# Patient Record
Sex: Male | Born: 2000 | Race: White | Hispanic: No | Marital: Single | Smoking: Current every day smoker
Health system: Southern US, Community
[De-identification: ages and names within clinical notes are randomized; demographics above are authoritative.]

## PROBLEM LIST (undated history)

## (undated) DIAGNOSIS — F329 Major depressive disorder, single episode, unspecified: Secondary | ICD-10-CM

## (undated) DIAGNOSIS — F32A Depression, unspecified: Secondary | ICD-10-CM

## (undated) DIAGNOSIS — G43909 Migraine, unspecified, not intractable, without status migrainosus: Secondary | ICD-10-CM

## (undated) HISTORY — DX: Depression, unspecified: F32.A

## (undated) HISTORY — DX: Major depressive disorder, single episode, unspecified: F32.9

## (undated) HISTORY — PX: OTHER SURGICAL HISTORY: SHX169

## (undated) HISTORY — DX: Migraine, unspecified, not intractable, without status migrainosus: G43.909

## (undated) HISTORY — PX: HERNIA REPAIR: SHX51

---
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
## 2018-08-24 ENCOUNTER — Telehealth: Payer: Self-pay

## 2018-08-24 NOTE — Telephone Encounter (Signed)
Pt mom advised we can see him on Thursday in office if worse need go to ED

## 2018-08-24 NOTE — Telephone Encounter (Signed)
-----   Message from Lyndon Code, MD sent at 08/24/2018  1:55 PM EDT ----- Jeff Coffey can see him next week, if worse got to ED  ----- Message ----- From: Golda Acre Sent: 08/24/2018  10:25 AM EDT To: Lyndon Code, MD  Pt mom called that his son is having chest pain and on and off going on for about 5 months  and no other symptoms and heart rate 129 he was last seen 4/19 no next appt and she said he had echo done my 2018 [facility] and care every where she she need referral for cardiologist

## 2018-08-27 ENCOUNTER — Encounter: Payer: Self-pay | Admitting: Nurse Practitioner

## 2018-08-27 ENCOUNTER — Other Ambulatory Visit: Payer: Self-pay

## 2018-08-27 ENCOUNTER — Ambulatory Visit (INDEPENDENT_AMBULATORY_CARE_PROVIDER_SITE_OTHER): Payer: Medicaid Other | Admitting: Nurse Practitioner

## 2018-08-27 VITALS — BP 108/82 | HR 60 | Resp 16 | Ht 67.0 in | Wt 124.0 lb

## 2018-08-27 DIAGNOSIS — F17210 Nicotine dependence, cigarettes, uncomplicated: Secondary | ICD-10-CM | POA: Diagnosis not present

## 2018-08-27 DIAGNOSIS — R630 Anorexia: Secondary | ICD-10-CM | POA: Insufficient documentation

## 2018-08-27 DIAGNOSIS — R9431 Abnormal electrocardiogram [ECG] [EKG]: Secondary | ICD-10-CM | POA: Diagnosis not present

## 2018-08-27 DIAGNOSIS — R002 Palpitations: Secondary | ICD-10-CM | POA: Diagnosis not present

## 2018-08-27 NOTE — Progress Notes (Signed)
[facility] [address] [place], [place] [address]  Internal MEDICINE  Office Visit Note  Patient Name: Jeff DawleyBilly Coffey  [birth year]  [id number]  Date of Service: 08/27/2018  Chief Complaint  Patient presents with  . Chest Pain    below left side feels a pain every once in a while , feels like heart races faster at times      The patient is c/o palpitations in the chest. Has been happening off and on for past five or six months. Does not hurt in his chest. Does cause him some shortness of breath. Gets worried when he feels the palpitations and may contribute to his shortness of breath. He is able to calm himself down and heart beat goes back to normal. He admits to drinking caffeine frequently, mostly in the form of [facility].  He is a smoker. States that he does not have much of an appetite. This does not contribute to feelings of palpitations.   Pt is here for a sick visit.     Current Medication:  Outpatient Encounter Medications as of 08/27/2018  Medication Sig  . butalbital-acetaminophen-caffeine (FIORICET, ESGIC) 50-325-40 MG tablet One tab po bid prn for migraine (Patient not taking: Reported on 08/27/2018)  . divalproex (DEPAKOTE ER) 500 MG 24 hr tablet Take one tab at bed time for migraine headaches (Patient not taking: Reported on 08/27/2018)  . [DISCONTINUED] amphetamine-dextroamphetamine (ADDERALL XR) 10 MG 24 hr capsule Take 10 mg by mouth daily.  . [DISCONTINUED] Aspirin-Acetaminophen-Caffeine ([employer]S MIGRAINE RELIEF PO) Take by mouth.  . [DISCONTINUED] omeprazole (PRILOSEC) 20 MG capsule Take 1 capsule (20 mg total) by mouth daily.   No facility-administered encounter medications on file as of 08/27/2018.       Medical History: Past Medical History:  Diagnosis Date  . Depression   . Migraines      Today's Vitals   08/27/18 0947  BP: 108/82  Pulse: 60  Resp: 16  SpO2: 95%  Weight: 124 lb (56.2 kg)  Height: 5\' 7"  (1.702 m)   Body mass index is  19.42 kg/m.  Review of Systems  Constitutional: Positive for appetite change. Negative for chills, fatigue and unexpected weight change.  HENT: Negative for congestion, postnasal drip, rhinorrhea, sneezing and sore throat.   Respiratory: Negative for cough, chest tightness, shortness of breath and wheezing.   Cardiovascular: Positive for palpitations. Negative for chest pain.  Gastrointestinal: Negative for abdominal pain, constipation, diarrhea, nausea and vomiting.  Endocrine: Negative for cold intolerance, heat intolerance, polydipsia and polyuria.  Musculoskeletal: Negative for arthralgias, back pain, joint swelling and neck pain.  Skin: Negative for rash.  Neurological: Negative for dizziness, tremors, numbness and headaches.  Hematological: Negative for adenopathy. Does not bruise/bleed easily.  Psychiatric/Behavioral: Negative for behavioral problems (Depression), sleep disturbance and suicidal ideas. The patient is nervous/anxious.     Physical Exam Vitals signs and nursing note reviewed.  Constitutional:      General: He is not in acute distress.    Appearance: Normal appearance. He is well-developed. He is not diaphoretic.  HENT:     Head: Normocephalic and atraumatic.     Mouth/Throat:     Pharynx: No oropharyngeal exudate.  Eyes:     Pupils: Pupils are equal, round, and reactive to light.  Neck:     Musculoskeletal: Normal range of motion and neck supple.     Thyroid: No thyromegaly.     Vascular: No JVD.     Trachea: No tracheal deviation.  Cardiovascular:  Rate and Rhythm: Normal rate and regular rhythm.     Heart sounds: Normal heart sounds. No murmur. No friction rub. No gallop.      Comments: ECG showing sinus rhythm with abnormal QRS complex.  Pulmonary:     Effort: Pulmonary effort is normal. No respiratory distress.     Breath sounds: Normal breath sounds. No wheezing or rales.  Chest:     Chest wall: No tenderness.  Abdominal:     General: Bowel  sounds are normal.     Palpations: Abdomen is soft.  Musculoskeletal: Normal range of motion.  Lymphadenopathy:     Cervical: No cervical adenopathy.  Skin:    General: Skin is warm and dry.  Neurological:     Mental Status: He is alert and oriented to person, place, and time.     Cranial Nerves: No cranial nerve deficit.  Psychiatric:        Behavior: Behavior normal.        Thought Content: Thought content normal.        Judgment: Judgment normal.   Assessment/Plan: 1. Palpitations ecg today showing sinus rhythm with abnormla QRS complex. Will get chest x-ray and echocardiogram for further evaluation. Will also check BMP, CBC, and thyroid panel. Advised him that nicotine and caffeine are both stimulants and may contribute to palpitations. He should cut back or eliminate these to see if this improves palpitations . - EKG 12-Lead - DG Chest 2 View; Future - ECHOCARDIOGRAM COMPLETE; Future  2. Abnormal ECG ecg today showing sinus rhythm with abnormla QRS complex. Will get chest x-ray and echocardiogram for further evaluation. Will also check BMP, CBC, and thyroid panel.  - ECHOCARDIOGRAM COMPLETE; Future  3. Decreased appetite Recommend he take prescribed prilosec   4. Cigarette smoker Discussed nicotine being stimulant and may contribute to palpitations. Encouraged him to cut back or stop altogether to improve symptoms .  General Counseling: Noel verbalizes understanding of the findings of todays visit and agrees with plan of treatment. I have discussed any further diagnostic evaluation that may be needed or ordered today. We also reviewed his medications today. he has been encouraged to call the office with any questions or concerns that should arise related to todays visit.    Counseling:  Smoking cessation counseling: 1. Pt acknowledges the risks of long term smoking, she will try to quite smoking. 2. Options for different medications including nicotine products, chewing  gum, patch etc, Wellbutrin and Chantix is discussed 3. Goal and date of compete cessation is discussed 4. Total time spent in smoking cessation is 15 min.  This patient was seen by Vincent Gros FNP Collaboration with Dr Lyndon Code as a part of collaborative care agreement   Orders Placed This Encounter  Procedures  . DG Chest 2 View  . EKG 12-Lead  . ECHOCARDIOGRAM COMPLETE     Time spent: 30 Minutes

## 2018-09-22 ENCOUNTER — Ambulatory Visit
Admission: RE | Admit: 2018-09-22 | Discharge: 2018-09-22 | Disposition: A | Discharge status: Home / Self Care | Payer: Medicaid Other | Source: Ambulatory Visit | Attending: Nurse Practitioner | Admitting: Nurse Practitioner

## 2018-09-22 ENCOUNTER — Other Ambulatory Visit: Payer: Self-pay

## 2018-09-22 DIAGNOSIS — R002 Palpitations: Secondary | ICD-10-CM

## 2018-09-25 ENCOUNTER — Other Ambulatory Visit: Payer: Self-pay

## 2018-09-25 ENCOUNTER — Ambulatory Visit: Payer: Medicaid Other

## 2018-09-25 ENCOUNTER — Other Ambulatory Visit: Payer: Self-pay | Admitting: Nurse Practitioner

## 2018-09-25 DIAGNOSIS — R9431 Abnormal electrocardiogram [ECG] [EKG]: Secondary | ICD-10-CM

## 2018-09-25 DIAGNOSIS — R002 Palpitations: Secondary | ICD-10-CM | POA: Diagnosis not present

## 2018-09-26 LAB — BASIC METABOLIC PANEL
BUN/Creatinine Ratio: 10 (ref 9–20)
BUN: 11 mg/dL (ref 6–20)
CO2: 27 mmol/L (ref 20–29)
Calcium: 9.2 mg/dL (ref 8.7–10.2)
Chloride: 102 mmol/L (ref 96–106)
Creatinine, Ser: 1.05 mg/dL (ref 0.76–1.27)
GFR calc Af Amer: 118 mL/min/{1.73_m2} (ref >59)
GFR calc non Af Amer: 102 mL/min/{1.73_m2} (ref >59)
Glucose: 81 mg/dL (ref 65–99)
Potassium: 3.7 mmol/L (ref 3.5–5.2)
Sodium: 143 mmol/L (ref 134–144)

## 2018-09-26 LAB — CBC
Hematocrit: 47.3 % (ref 37.5–51.0)
Hemoglobin: 16.7 g/dL (ref 13.0–17.7)
MCH: 30.8 pg (ref 26.6–33.0)
MCHC: 35.3 g/dL (ref 31.5–35.7)
MCV: 87 fL (ref 79–97)
Platelets: 206 10*3/uL (ref 150–450)
RBC: 5.42 x10E6/uL (ref 4.14–5.80)
RDW: 11.8 % (ref 11.6–15.4)
WBC: 9.7 10*3/uL (ref 3.4–10.8)

## 2018-09-26 LAB — T4, FREE: Free T4: 1.58 ng/dL (ref 0.93–1.60)

## 2018-09-26 LAB — HGB A1C W/O EAG: Hgb A1c MFr Bld: 5.1 % (ref 4.8–5.6)

## 2018-09-26 LAB — TSH: TSH: 4.8 u[IU]/mL — ABNORMAL HIGH (ref 0.450–4.500)

## 2018-10-01 ENCOUNTER — Ambulatory Visit (INDEPENDENT_AMBULATORY_CARE_PROVIDER_SITE_OTHER): Payer: Medicaid Other | Admitting: Adult Health

## 2018-10-01 ENCOUNTER — Encounter: Payer: Self-pay | Admitting: Adult Health

## 2018-10-01 VITALS — BP 113/67 | HR 69 | Resp 16 | Ht 67.0 in | Wt 126.8 lb

## 2018-10-01 DIAGNOSIS — F17200 Nicotine dependence, unspecified, uncomplicated: Secondary | ICD-10-CM

## 2018-10-01 DIAGNOSIS — R7989 Other specified abnormal findings of blood chemistry: Secondary | ICD-10-CM | POA: Diagnosis not present

## 2018-10-01 DIAGNOSIS — R002 Palpitations: Secondary | ICD-10-CM

## 2018-10-01 NOTE — Progress Notes (Signed)
[facility] [address] [place], [place] [address]  Internal MEDICINE  Office Visit Note  Patient Name: Jeff DawleyBilly Coffey  [id number] 29, 2002  [id number]  Date of Service: 10/01/2018  Chief Complaint  Patient presents with  . Labs Only  . Results    cxr and echo  . Follow-up    HPI PT is here today to follow up on CXR, blood work and Echo.  He was seen for palpitations last month, and reports he has had a few episodes, but nothing since. His echo and CXR were unremarkable. His Lab work however shows an elevated TSH. He denies any new or bothersome symptoms at this time.       Current Medication: Outpatient Encounter Medications as of 10/01/2018  Medication Sig  . butalbital-acetaminophen-caffeine (FIORICET, ESGIC) 50-325-40 MG tablet One tab po bid prn for migraine  . divalproex (DEPAKOTE ER) 500 MG 24 hr tablet Take one tab at bed time for migraine headaches   No facility-administered encounter medications on file as of 10/01/2018.     Surgical History: Past Surgical History:  Procedure Laterality Date  . heart valve stent    . HERNIA REPAIR      Medical History: Past Medical History:  Diagnosis Date  . Depression   . Migraines     Family History: Family History  Problem Relation Age of Onset  . COPD Mother     Social History   Socioeconomic History  . Marital status: Single    Spouse name: Not on file  . Number of children: Not on file  . Years of education: Not on file  . Highest education level: Not on file  Occupational History  . Not on file  Social Needs  . Financial resource strain: Not on file  . Food insecurity:    Worry: Not on file    Inability: Not on file  . Transportation needs:    Medical: Not on file    Non-medical: Not on file  Tobacco Use  . Smoking status: Current Every Day Smoker    Packs/day: 1.00    Types: Cigarettes  . Smokeless tobacco: Former Engineer, waterUser  Substance and Sexual Activity  . Alcohol use: Never    Frequency: Never   . Drug use: Never  . Sexual activity: Not on file  Lifestyle  . Physical activity:    Days per week: Not on file    Minutes per session: Not on file  . Stress: Not on file  Relationships  . Social connections:    Talks on phone: Not on file    Gets together: Not on file    Attends religious service: Not on file    Active member of club or organization: Not on file    Attends meetings of clubs or organizations: Not on file    Relationship status: Not on file  . Intimate partner violence:    Fear of current or ex partner: Not on file    Emotionally abused: Not on file    Physically abused: Not on file    Forced sexual activity: Not on file  Other Topics Concern  . Not on file  Social History Narrative  . Not on file      Review of Systems  Constitutional: Negative.  Negative for chills, fatigue and unexpected weight change.  HENT: Negative.  Negative for congestion, rhinorrhea, sneezing and sore throat.   Eyes: Negative for redness.  Respiratory: Negative.  Negative for cough, chest tightness and shortness of breath.  Cardiovascular: Negative.  Negative for chest pain and palpitations.  Gastrointestinal: Negative.  Negative for abdominal pain, constipation, diarrhea, nausea and vomiting.  Endocrine: Negative.   Genitourinary: Negative.  Negative for dysuria and frequency.  Musculoskeletal: Negative.  Negative for arthralgias, back pain, joint swelling and neck pain.  Skin: Negative.  Negative for rash.  Allergic/Immunologic: Negative.   Neurological: Negative.  Negative for tremors and numbness.  Hematological: Negative for adenopathy. Does not bruise/bleed easily.  Psychiatric/Behavioral: Negative.  Negative for behavioral problems, sleep disturbance and suicidal ideas. The patient is not nervous/anxious.     Vital Signs: BP 113/67   Pulse 69   Resp 16   Ht 5\' 7"  (1.702 m)   Wt 126 lb 12.8 oz (57.5 kg)   SpO2 100%   BMI 19.86 kg/m    Physical Exam Vitals  signs and nursing note reviewed.  Constitutional:      General: He is not in acute distress.    Appearance: He is well-developed. He is not diaphoretic.  HENT:     Head: Normocephalic and atraumatic.     Mouth/Throat:     Pharynx: No oropharyngeal exudate.  Eyes:     Pupils: Pupils are equal, round, and reactive to light.  Neck:     Musculoskeletal: Normal range of motion and neck supple.     Thyroid: No thyromegaly.     Vascular: No JVD.     Trachea: No tracheal deviation.  Cardiovascular:     Rate and Rhythm: Normal rate and regular rhythm.     Heart sounds: Normal heart sounds. No murmur. No friction rub. No gallop.   Pulmonary:     Effort: Pulmonary effort is normal. No respiratory distress.     Breath sounds: Normal breath sounds. No wheezing or rales.  Chest:     Chest wall: No tenderness.  Abdominal:     Palpations: Abdomen is soft.     Tenderness: There is no abdominal tenderness. There is no guarding.  Musculoskeletal: Normal range of motion.  Lymphadenopathy:     Cervical: No cervical adenopathy.  Skin:    General: Skin is warm and dry.  Neurological:     Mental Status: He is alert and oriented to person, place, and time.     Cranial Nerves: No cranial nerve deficit.  Psychiatric:        Behavior: Behavior normal.        Thought Content: Thought content normal.        Judgment: Judgment normal.      Assessment/Plan: 1. Elevated TSH Thyroid antibodies and peroxidase ordered.  As well as thyroid US.  Will follow up with patient when results are in.  - US THYROID; Future - Thyroid antibodies - Thyroid Stimulating Immunoglobulin - Thyroid Peroxidase Antibody  2. Palpitations None recently, but could be from elevated TSH. Instructed patient to continue to be vigilant.    3. Nicotine dependence with current use Smoking cessation counseling: 1. Pt acknowledges the risks of long term smoking, she will try to quite smoking. 2. Options for different medications  including nicotine products, chewing gum, patch etc, Wellbutrin and Chantix is discussed 3. Goal and date of compete cessation is discussed 4. Total time spent in smoking cessation is 15 min.   General Counseling: Rishaan verbalizes understanding of the findings of todays visit and agrees with plan of treatment. I have discussed any further diagnostic evaluation that may be needed or ordered today. We also reviewed his medications today. he has been encouraged  to call the office with any questions or concerns that should arise related to todays visit.    Orders Placed This Encounter  Procedures  . US THYROID    No orders of the defined types were placed in this encounter.   Time spent: 20 Minutes   This patient was seen by Blima Ledger AGNP-C in Collaboration with Dr Lyndon Code as a part of collaborative care agreement     Johnna Acosta AGNP-C Internal medicine

## 2018-10-16 ENCOUNTER — Other Ambulatory Visit: Payer: Self-pay

## 2018-10-16 ENCOUNTER — Ambulatory Visit (INDEPENDENT_AMBULATORY_CARE_PROVIDER_SITE_OTHER): Payer: Medicaid Other

## 2018-10-16 DIAGNOSIS — R7989 Other specified abnormal findings of blood chemistry: Secondary | ICD-10-CM | POA: Diagnosis not present

## 2018-10-21 ENCOUNTER — Telehealth: Payer: Self-pay

## 2018-10-21 NOTE — Telephone Encounter (Signed)
-----   Message from Lyndon Code, MD sent at 10/21/2018  8:46 AM EDT ----- 1. Aurelius Gildersleeve, let her mom know that he needs to go for lab work, make sure there is free t4 as well  2, Beth H ewill need follow up to discuss thyroid u/s and lab work once it is done  ----- Message ----- From: Cecilio Asper Sent: 10/16/2018   3:16 PM EDT To: Johnna Acosta, NP

## 2018-10-21 NOTE — Telephone Encounter (Signed)
Spoke with pt mom we send lab order do labs and make follow up for u/s and lab result

## 2020-06-13 ENCOUNTER — Telehealth: Payer: Self-pay

## 2020-06-13 ENCOUNTER — Encounter: Payer: Self-pay | Admitting: Internal Medicine

## 2020-06-13 ENCOUNTER — Ambulatory Visit (INDEPENDENT_AMBULATORY_CARE_PROVIDER_SITE_OTHER): Payer: Medicaid Other | Admitting: Internal Medicine

## 2020-06-13 VITALS — Temp 97.7°F | Ht 67.0 in | Wt 125.0 lb

## 2020-06-13 DIAGNOSIS — R1084 Generalized abdominal pain: Secondary | ICD-10-CM

## 2020-06-13 DIAGNOSIS — K5900 Constipation, unspecified: Secondary | ICD-10-CM

## 2020-06-13 NOTE — Progress Notes (Signed)
[facility] [address] [place], [place] [address]  Internal MEDICINE  Office Visit Note  Patient Name: Jeff Coffey  [id number]  [id number]  Date of Service: 06/20/2020  Chief Complaint  Patient presents with  . Telephone Screen    Pt contact number [phone]  . telephone assesment   . Abdominal Pain    HPI  Pt is connected for acute and sick visit. C/O abdominal pain and constipation. Denies any fever or chills. He is not sure of COVID exposure at the moment. No fever or chills, denies nausea    Current Medication: Outpatient Encounter Medications as of 06/13/2020  Medication Sig  . butalbital-acetaminophen-caffeine (FIORICET, ESGIC) 50-325-40 MG tablet One tab po bid prn for migraine  . divalproex (DEPAKOTE ER) 500 MG 24 hr tablet Take one tab at bed time for migraine headaches   No facility-administered encounter medications on file as of 06/13/2020.    Surgical History: Past Surgical History:  Procedure Laterality Date  . heart valve stent    . HERNIA REPAIR      Medical History: Past Medical History:  Diagnosis Date  . Depression   . Migraines     Family History: Family History  Problem Relation Age of Onset  . COPD Mother     Social History   Socioeconomic History  . Marital status: Single    Spouse name: Not on file  . Number of children: Not on file  . Years of education: Not on file  . Highest education level: Not on file  Occupational History  . Not on file  Tobacco Use  . Smoking status: Current Every Day Smoker    Packs/day: 1.00    Types: Cigarettes  . Smokeless tobacco: Former Clinical biochemist  . Vaping Use: Former  Substance and Sexual Activity  . Alcohol use: Never  . Drug use: Never  . Sexual activity: Not on file  Other Topics Concern  . Not on file  Social History Narrative  . Not on file   Social Determinants of Health   Financial Resource Strain: Not on file  Food Insecurity: Not on file   Transportation Needs: Not on file  Physical Activity: Not on file  Stress: Not on file  Social Connections: Not on file  Intimate Partner Violence: Not on file      Review of Systems  Constitutional: Negative for fatigue and fever.  HENT: Negative for congestion, mouth sores and postnasal drip.   Respiratory: Negative for cough.   Cardiovascular: Negative for chest pain.  Gastrointestinal: Positive for abdominal pain and constipation.  Genitourinary: Negative for flank pain.  Psychiatric/Behavioral: Negative.     Vital Signs: Temp 97.7 F (36.5 C)   Ht 5\' 7"  (1.702 m)   Wt 125 lb (56.7 kg)   BMI 19.58 kg/m    Physical Exam  Mother was connected. She was speaking on behalf of her son  Assessment/Plan: 1. Generalized abdominal cramping Pt is instructed to get COVID screening so he can be evaluated in person  2. Constipation, unspecified constipation type Needs to be further evaluated, had negative abdominal u/s last year. Might need plain abdominal Xray   General Counseling: Harbor verbalizes understanding of the findings of todays visit and agrees with plan of treatment. I have discussed any further diagnostic evaluation that may be needed or ordered today. We also reviewed his medications today. he has been encouraged to call the office with any questions or concerns that should arise related to todays  visit.   A high fiber diet with plenty of fluids (up to 8 glasses of water daily) is suggested to relieve these symptoms.  Metamucil, 1 tablespoon once or twice daily can be used to keep bowels regular if needed.  Total time spent:15 Minutes Time spent includes review of chart, medications, test results, and follow up plan with the patient.   [place] Controlled Substance Database was reviewed by me.   Dr Lyndon Code Internal medicine

## 2020-06-13 NOTE — Telephone Encounter (Signed)
No follow up scheduled from visit today. Depending on covid test results pt will need in office visit follow up with Dr Welton Flakes. Ceciley Buist

## 2020-06-13 NOTE — Telephone Encounter (Signed)
Pt mom called that covid test is negative as per dr Welton Flakes advised to make follow up next  Week

## 2020-06-15 ENCOUNTER — Ambulatory Visit (INDEPENDENT_AMBULATORY_CARE_PROVIDER_SITE_OTHER): Payer: Medicaid Other | Admitting: Physician Assistant

## 2020-06-15 ENCOUNTER — Other Ambulatory Visit: Payer: Self-pay

## 2020-06-15 ENCOUNTER — Encounter: Payer: Self-pay | Admitting: Physician Assistant

## 2020-06-15 VITALS — BP 131/79 | HR 62 | Temp 97.4°F | Resp 16 | Ht 67.0 in | Wt 124.2 lb

## 2020-06-15 DIAGNOSIS — K219 Gastro-esophageal reflux disease without esophagitis: Secondary | ICD-10-CM | POA: Diagnosis not present

## 2020-06-15 DIAGNOSIS — K5900 Constipation, unspecified: Secondary | ICD-10-CM | POA: Diagnosis not present

## 2020-06-15 MED ORDER — FAMOTIDINE 20 MG PO TABS: 20.0000 mg | ORAL_TABLET | Freq: Two times a day (BID) | ORAL | 1 refills | Status: DC

## 2020-06-15 MED ORDER — LINACLOTIDE 72 MCG PO CAPS: 72.0000 ug | ORAL_CAPSULE | Freq: Every day | ORAL | 0 refills | Status: DC

## 2020-06-15 NOTE — Progress Notes (Signed)
[facility] [address] [place], [place] [address]  Internal MEDICINE Office Visit Note  Patient Name: Jeff Coffey  [id number]  [id number]  Date of Service: 06/15/2020  Chief Complaint  Patient presents with  . Acute Visit  . Abdominal Pain     HPI Pt is here for a sick visit. He has been experiencing constipation and abdominal pressure. Last BM was this AM after using OTC laxative (exlax) yesterday AM. Stomach felt better afterwards. Prior to this morning last BM was 2 days ago. Generalized discomfort throughout abdomen. This has happened in the past. Previously used metamucil drink after he went to the ED for a blockage in middle school and it resolved. No diet changes, but has reduced intake when constipation started. Experiencing nausea as well. Vomited several days ago, but nothing since then. He has been using zofran dissolveable tabs for Nausea. Currently pain has resolved, but still feels somewhat blocked up.  He denies alcohol use.   Current Medication:  Outpatient Encounter Medications as of 06/15/2020  Medication Sig  . famotidine (PEPCID) 20 MG tablet Take 1 tablet (20 mg total) by mouth 2 (two) times daily.  . [DISCONTINUED] linaclotide (LINZESS) 72 MCG capsule Take 72 mcg by mouth daily.  . butalbital-acetaminophen-caffeine (FIORICET, ESGIC) 50-325-40 MG tablet One tab po bid prn for migraine  . divalproex (DEPAKOTE ER) 500 MG 24 hr tablet Take one tab at bed time for migraine headaches   No facility-administered encounter medications on file as of 06/15/2020.      Medical History: Past Medical History:  Diagnosis Date  . Depression   . Migraines      Vital Signs: BP 131/79   Pulse 62   Temp (!) 97.4 F (36.3 C)   Resp 16   Ht 5\' 7"  (1.702 m)   Wt 124 lb 3.2 oz (56.3 kg)   SpO2 99%   BMI 19.45 kg/m    Review of Systems  Constitutional: Positive for appetite change. Negative for fatigue and fever.  HENT: Negative for congestion and  rhinorrhea.   Respiratory: Negative for cough and shortness of breath.   Cardiovascular: Negative for chest pain.  Gastrointestinal: Positive for abdominal distention, abdominal pain, constipation, nausea and vomiting. Negative for blood in stool.  Musculoskeletal: Negative for arthralgias.    Physical Exam Constitutional:      General: He is not in acute distress.    Appearance: He is well-developed. He is not diaphoretic.  HENT:     Head: Normocephalic and atraumatic.     Mouth/Throat:     Pharynx: No oropharyngeal exudate.  Eyes:     Pupils: Pupils are equal, round, and reactive to light.  Neck:     Thyroid: No thyromegaly.     Vascular: No JVD.     Trachea: No tracheal deviation.  Cardiovascular:     Rate and Rhythm: Normal rate and regular rhythm.     Heart sounds: Normal heart sounds. No murmur heard. No friction rub. No gallop.   Pulmonary:     Effort: Pulmonary effort is normal. No respiratory distress.     Breath sounds: No wheezing or rales.  Chest:     Chest wall: No tenderness.  Abdominal:     General: Abdomen is flat. Bowel sounds are normal. There is no distension.     Palpations: Abdomen is soft.     Tenderness: There is no abdominal tenderness.  Musculoskeletal:        General: Normal range of motion.  Cervical back: Normal range of motion and neck supple.  Lymphadenopathy:     Cervical: No cervical adenopathy.  Skin:    General: Skin is warm and dry.  Neurological:     Mental Status: He is alert and oriented to person, place, and time.     Cranial Nerves: No cranial nerve deficit.  Psychiatric:        Behavior: Behavior normal.        Thought Content: Thought content normal.        Judgment: Judgment normal.       Assessment/Plan:  1. Constipation, unspecified constipation type Instructed patient to increase water intake and use metamucil daily. He will also start Linzess every other day to help with chronic constipation. A high fiber  diet with plenty of fluids (up to 8 glasses of water daily) is suggested to relieve these symptoms.  Metamucil, 1 tablespoon once or twice daily can be used to keep bowels regular if needed.  2. Gastroesophageal reflux disease without esophagitis - Might need UGI  famotidine (PEPCID) 20 MG tablet; Take 1 tablet (20 mg total) by mouth 2 (two) times daily.  Dispense: 60 tablet; Refill: 1  General Counseling: Jeff Coffey verbalizes understanding of the findings of todays visit and agrees with plan of treatment. I have discussed any further diagnostic evaluation that may be needed or ordered today. We also reviewed his medications today. he has been encouraged to call the office with any questions or concerns that should arise related to todays visit.     Counseling:    No orders of the defined types were placed in this encounter.   Meds ordered this encounter  Medications  . famotidine (PEPCID) 20 MG tablet    Sig: Take 1 tablet (20 mg total) by mouth 2 (two) times daily.    Dispense:  60 tablet    Refill:  1    Time spent:20 Minutes

## 2020-06-19 ENCOUNTER — Other Ambulatory Visit: Payer: Self-pay | Admitting: Hospice and Palliative Medicine

## 2020-06-19 MED ORDER — BISACODYL 10 MG RE SUPP: 10.0000 mg | RECTAL | 0 refills | Status: AC | PRN

## 2020-06-20 ENCOUNTER — Ambulatory Visit: Payer: Medicaid Other | Admitting: Internal Medicine

## 2020-06-29 ENCOUNTER — Emergency Department: Payer: Medicaid Other

## 2020-06-29 ENCOUNTER — Emergency Department
Admission: EM | Admit: 2020-06-29 | Discharge: 2020-06-29 | Disposition: A | Discharge status: Home / Self Care | Payer: Medicaid Other | Attending: Student in an Organized Health Care Education/Training Program | Admitting: Student in an Organized Health Care Education/Training Program

## 2020-06-29 ENCOUNTER — Other Ambulatory Visit: Payer: Self-pay

## 2020-06-29 DIAGNOSIS — F17210 Nicotine dependence, cigarettes, uncomplicated: Secondary | ICD-10-CM | POA: Diagnosis not present

## 2020-06-29 DIAGNOSIS — R1084 Generalized abdominal pain: Secondary | ICD-10-CM | POA: Diagnosis present

## 2020-06-29 DIAGNOSIS — K5900 Constipation, unspecified: Secondary | ICD-10-CM | POA: Insufficient documentation

## 2020-06-29 LAB — BASIC METABOLIC PANEL
Anion gap: 9 (ref 5–15)
BUN: 13 mg/dL (ref 6–20)
CO2: 26 mmol/L (ref 22–32)
Calcium: 9.3 mg/dL (ref 8.9–10.3)
Chloride: 104 mmol/L (ref 98–111)
Creatinine, Ser: 0.98 mg/dL (ref 0.61–1.24)
GFR, Estimated: >60 mL/min (ref >60)
Glucose, Bld: 106 mg/dL — ABNORMAL HIGH (ref 70–99)
Potassium: 4 mmol/L (ref 3.5–5.1)
Sodium: 139 mmol/L (ref 135–145)

## 2020-06-29 LAB — CBC
HCT: 43.7 % (ref 39.0–52.0)
Hemoglobin: 14.9 g/dL (ref 13.0–17.0)
MCH: 30.5 pg (ref 26.0–34.0)
MCHC: 34.1 g/dL (ref 30.0–36.0)
MCV: 89.5 fL (ref 80.0–100.0)
Platelets: 190 10*3/uL (ref 150–400)
RBC: 4.88 MIL/uL (ref 4.22–5.81)
RDW: 12 % (ref 11.5–15.5)
WBC: 9 10*3/uL (ref 4.0–10.5)
nRBC: 0 % (ref 0.0–0.2)

## 2020-06-29 MED ORDER — METOCLOPRAMIDE HCL 10 MG PO TABS: 10.0000 mg | ORAL_TABLET | Freq: Three times a day (TID) | ORAL | 0 refills | Status: DC

## 2020-06-29 NOTE — ED Provider Notes (Signed)
[facility] Emergency Department Provider Note    Event Date/Time   First MD Initiated Contact with Patient 06/29/20 [phone]     (approximate)  I have reviewed the triage vital signs and the nursing notes.   HISTORY  Chief Complaint No chief complaint on file.    HPI [place] R. Neale Marzette. is a 20 y.o. male below listed past medical history presents to the ER for evaluation of constipation and crampy abdominal pain.  Has a longstanding history of constipation currently on MiraLAX as well as Linzess.  States he did miss a dose of Linzess yesterday because he was already at work and did not want to be stuck on the commode.  States he has used suppositories in the past with good relief.  Was having worsening crampy generalized abdominal pain today and concern for not moving his bowel movements and that is why he came to the ER.  He has not seen a GI specialist before.    Past Medical History:  Diagnosis Date  . Depression   . Migraines    Family History  Problem Relation Age of Onset  . COPD Mother    Past Surgical History:  Procedure Laterality Date  . heart valve stent    . HERNIA REPAIR     Patient Active Problem List   Diagnosis Date Noted  . Palpitations 08/27/2018  . Abnormal ECG 08/27/2018  . Decreased appetite 08/27/2018  . Cigarette smoker 08/27/2018      Prior to Admission medications   Medication Sig Start Date End Date Taking? Authorizing Provider  metoCLOPramide (REGLAN) 10 MG tablet Take 1 tablet (10 mg total) by mouth 3 (three) times daily with meals. 06/29/20 06/29/21 Yes Willy Eddy, MD  bisacodyl (DULCOLAX) 10 MG suppository Place 1 suppository (10 mg total) rectally as needed for moderate constipation. 06/19/20   Theotis Burrow, NP  butalbital-acetaminophen-caffeine (FIORICET, ESGIC) [phone] MG tablet One tab po bid prn for migraine 09/02/17   Lyndon Code, MD  divalproex (DEPAKOTE ER) 500 MG 24 hr tablet Take one tab at bed  time for migraine headaches 09/02/17   Lyndon Code, MD  famotidine (PEPCID) 20 MG tablet Take 1 tablet (20 mg total) by mouth 2 (two) times daily. 06/15/20   Lyndon Code, MD  linaclotide [facility]) 72 MCG capsule Take 1 capsule (72 mcg total) by mouth daily. 06/15/20   Lyndon Code, MD    Allergies Patient has no known allergies.    Social History Social History   Tobacco Use  . Smoking status: Current Every Day Smoker    Packs/day: 1.00    Types: Cigarettes  . Smokeless tobacco: Former Clinical biochemist  . Vaping Use: Former  Substance Use Topics  . Alcohol use: Never  . Drug use: Never    Review of Systems Patient denies headaches, rhinorrhea, blurry vision, numbness, shortness of breath, chest pain, edema, cough, abdominal pain, nausea, vomiting, diarrhea, dysuria, fevers, rashes or hallucinations unless otherwise stated above in HPI. ____________________________________________   PHYSICAL EXAM:  VITAL SIGNS: Vitals:   06/29/20 1000 06/29/20 1030  BP:    Pulse: 73 (!) 58  Resp: 14 19  Temp:    SpO2: 100% 99%    Constitutional: Alert and oriented.  Eyes: Conjunctivae are normal.  Head: Atraumatic. Nose: No congestion/rhinnorhea. Mouth/Throat: Mucous membranes are moist.   Neck: No stridor. Painless ROM.  Cardiovascular: Normal rate, regular rhythm. Grossly normal heart sounds.  Good peripheral circulation.  Respiratory: Normal respiratory effort.  No retractions. Lungs CTAB. Gastrointestinal: Soft and nontender. No distention. No abdominal bruits. No CVA tenderness. Genitourinary:  Musculoskeletal: No lower extremity tenderness nor edema.  No joint effusions. Neurologic:  Normal speech and language. No gross focal neurologic deficits are appreciated. No facial droop Skin:  Skin is warm, dry and intact. No rash noted. Psychiatric: Mood and affect are normal. Speech and behavior are normal.  ____________________________________________   LABS (all labs  ordered are listed, but only abnormal results are displayed)  Results for orders placed or performed during the hospital encounter of 06/29/20 (from the past 24 hour(s))  CBC     Status: None   Collection Time: 06/29/20  9:12 AM  Result Value Ref Range   WBC 9.0 4.0 - 10.5 K/uL   RBC 4.88 4.22 - 5.81 MIL/uL   Hemoglobin 14.9 13.0 - 17.0 g/dL   HCT 41.7 40.8 - 14.4 %   MCV 89.5 80.0 - 100.0 fL   MCH 30.5 26.0 - 34.0 pg   MCHC 34.1 30.0 - 36.0 g/dL   RDW 81.8 56.3 - 14.9 %   Platelets 190 150 - 400 K/uL   nRBC 0.0 0.0 - 0.2 %  Basic metabolic panel     Status: Abnormal   Collection Time: 06/29/20  9:12 AM  Result Value Ref Range   Sodium 139 135 - 145 mmol/L   Potassium 4.0 3.5 - 5.1 mmol/L   Chloride 104 98 - 111 mmol/L   CO2 26 22 - 32 mmol/L   Glucose, Bld 106 (H) 70 - 99 mg/dL   BUN 13 6 - 20 mg/dL   Creatinine, Ser 7.02 0.61 - 1.24 mg/dL   Calcium 9.3 8.9 - 63.7 mg/dL   GFR, Estimated >85 >88 mL/min   Anion gap 9 5 - 15   ____________________________________________ ____________________________________________  RADIOLOGY  I personally reviewed all radiographic images ordered to evaluate for the above acute complaints and reviewed radiology reports and findings.  These findings were personally discussed with the patient.  Please see medical record for radiology report.  ____________________________________________   PROCEDURES  Procedure(s) performed:  Procedures    Critical Care performed: no ____________________________________________   INITIAL IMPRESSION / ASSESSMENT AND PLAN / ED COURSE  Pertinent labs & imaging results that were available during my care of the patient were reviewed by me and considered in my medical decision making (see chart for details).   DDX: constipation, obstruction, ibs, ibd,  [place] R. Earl Losee. is a 20 y.o. who presents to the ED with presentation as described above.  Patient well-appearing in no acute distress.  Long history of  constipation.  Blood work is reassuring.  Exam is nontender.  Abdominal film shows no evidence of obstructive pattern or free air.  Do suspect moderate constipation.  He took one of his mother's Reglan this morning and felt some improvement therefore will write him prescription for that.  Will give referral to GI.  Patient agreeable to plan     The patient was evaluated in Emergency Department today for the symptoms described in the history of present illness. He/she was evaluated in the context of the global COVID-19 pandemic, which necessitated consideration that the patient might be at risk for infection with the SARS-CoV-2 virus that causes COVID-19. Institutional protocols and algorithms that pertain to the evaluation of patients at risk for COVID-19 are in a state of rapid change based on information released by regulatory bodies including the CDC and federal and  state organizations. These policies and algorithms were followed during the patient's care in the ED.  As part of my medical decision making, I reviewed the following data within the electronic MEDICAL RECORD NUMBER Nursing notes reviewed and incorporated, Labs reviewed, notes from prior ED visits and [place] Controlled Substance Database   ____________________________________________   FINAL CLINICAL IMPRESSION(S) / ED DIAGNOSES  Final diagnoses:  Constipation      NEW MEDICATIONS STARTED DURING THIS VISIT:  New Prescriptions   METOCLOPRAMIDE (REGLAN) 10 MG TABLET    Take 1 tablet (10 mg total) by mouth 3 (three) times daily with meals.     Note:  This document was prepared using Dragon voice recognition software and may include unintentional dictation errors.    Willy Eddy, MD 06/29/20 1121

## 2020-06-29 NOTE — Discharge Instructions (Signed)

## 2020-06-29 NOTE — ED Triage Notes (Signed)
Pt here for abdominal pain and severe constipation for few days.

## 2020-06-29 NOTE — ED Notes (Signed)
Patient transported to X-ray 

## 2020-06-30 ENCOUNTER — Other Ambulatory Visit: Payer: Self-pay | Admitting: Hospice and Palliative Medicine

## 2020-06-30 DIAGNOSIS — K5900 Constipation, unspecified: Secondary | ICD-10-CM

## 2020-06-30 DIAGNOSIS — R1084 Generalized abdominal pain: Secondary | ICD-10-CM

## 2020-07-03 ENCOUNTER — Other Ambulatory Visit: Payer: Self-pay | Admitting: Hospice and Palliative Medicine

## 2020-07-03 MED ORDER — METOCLOPRAMIDE HCL 10 MG PO TABS: 10.0000 mg | ORAL_TABLET | Freq: Three times a day (TID) | ORAL | 0 refills | Status: DC

## 2020-07-08 ENCOUNTER — Other Ambulatory Visit: Payer: Self-pay | Admitting: Hospice and Palliative Medicine

## 2020-07-08 MED ORDER — LINACLOTIDE 72 MCG PO CAPS: 72.0000 ug | ORAL_CAPSULE | Freq: Every day | ORAL | 0 refills | Status: DC

## 2020-07-08 MED ORDER — LINACLOTIDE 145 MCG PO CAPS: 145.0000 ug | ORAL_CAPSULE | Freq: Every day | ORAL | 1 refills | Status: DC

## 2020-07-26 IMAGING — CR CHEST - 2 VIEW
1 series · 2 of 2 positions shown · non-contrast
Comparison: Report 06/01/2003

CLINICAL DATA: Palpitation with chest discomfort

EXAM:
CHEST - 2 VIEW

[Series 1: dg chest 2 view · 0.14mm/px · 2 of 2 slices shown]
[im 1/2]
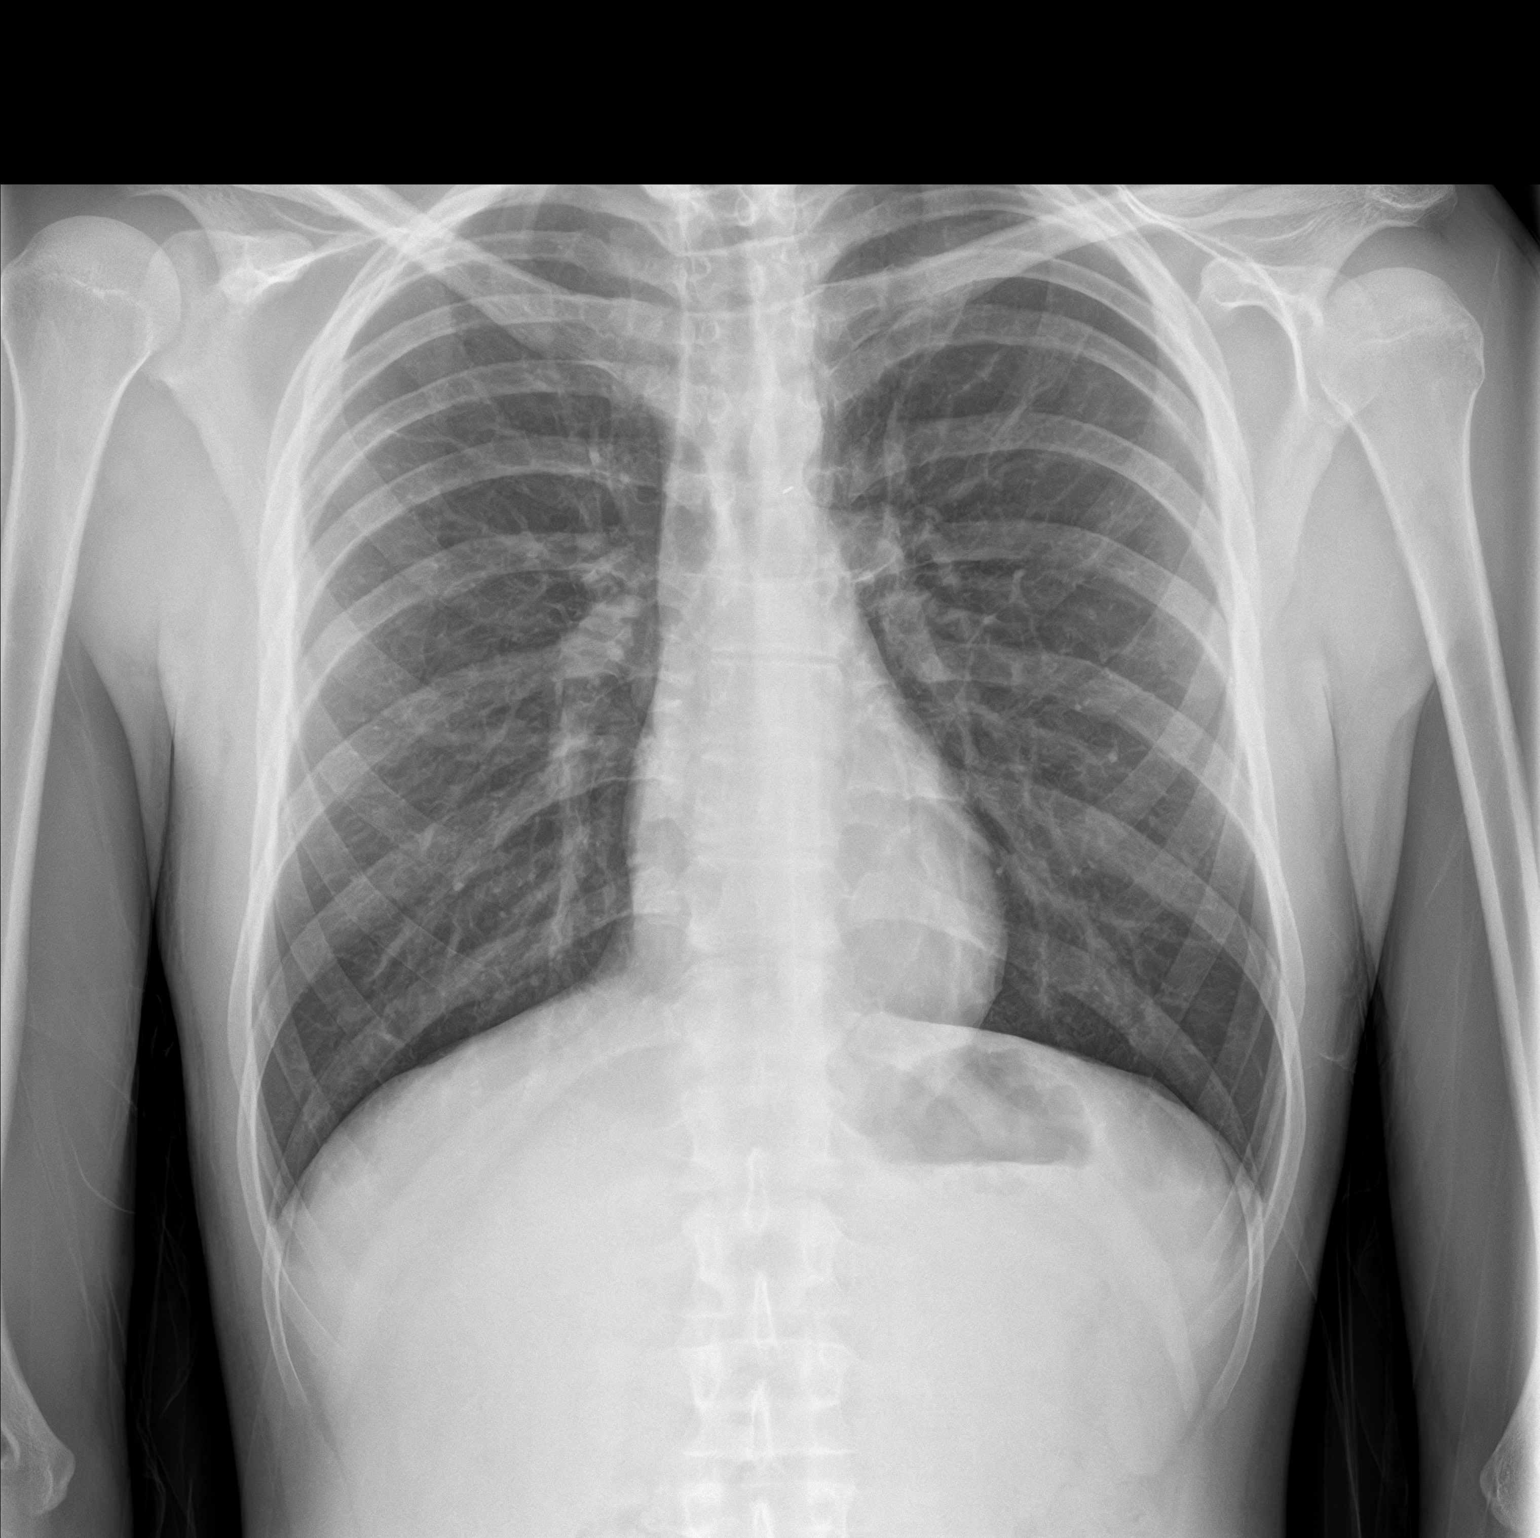
[im 2/2]
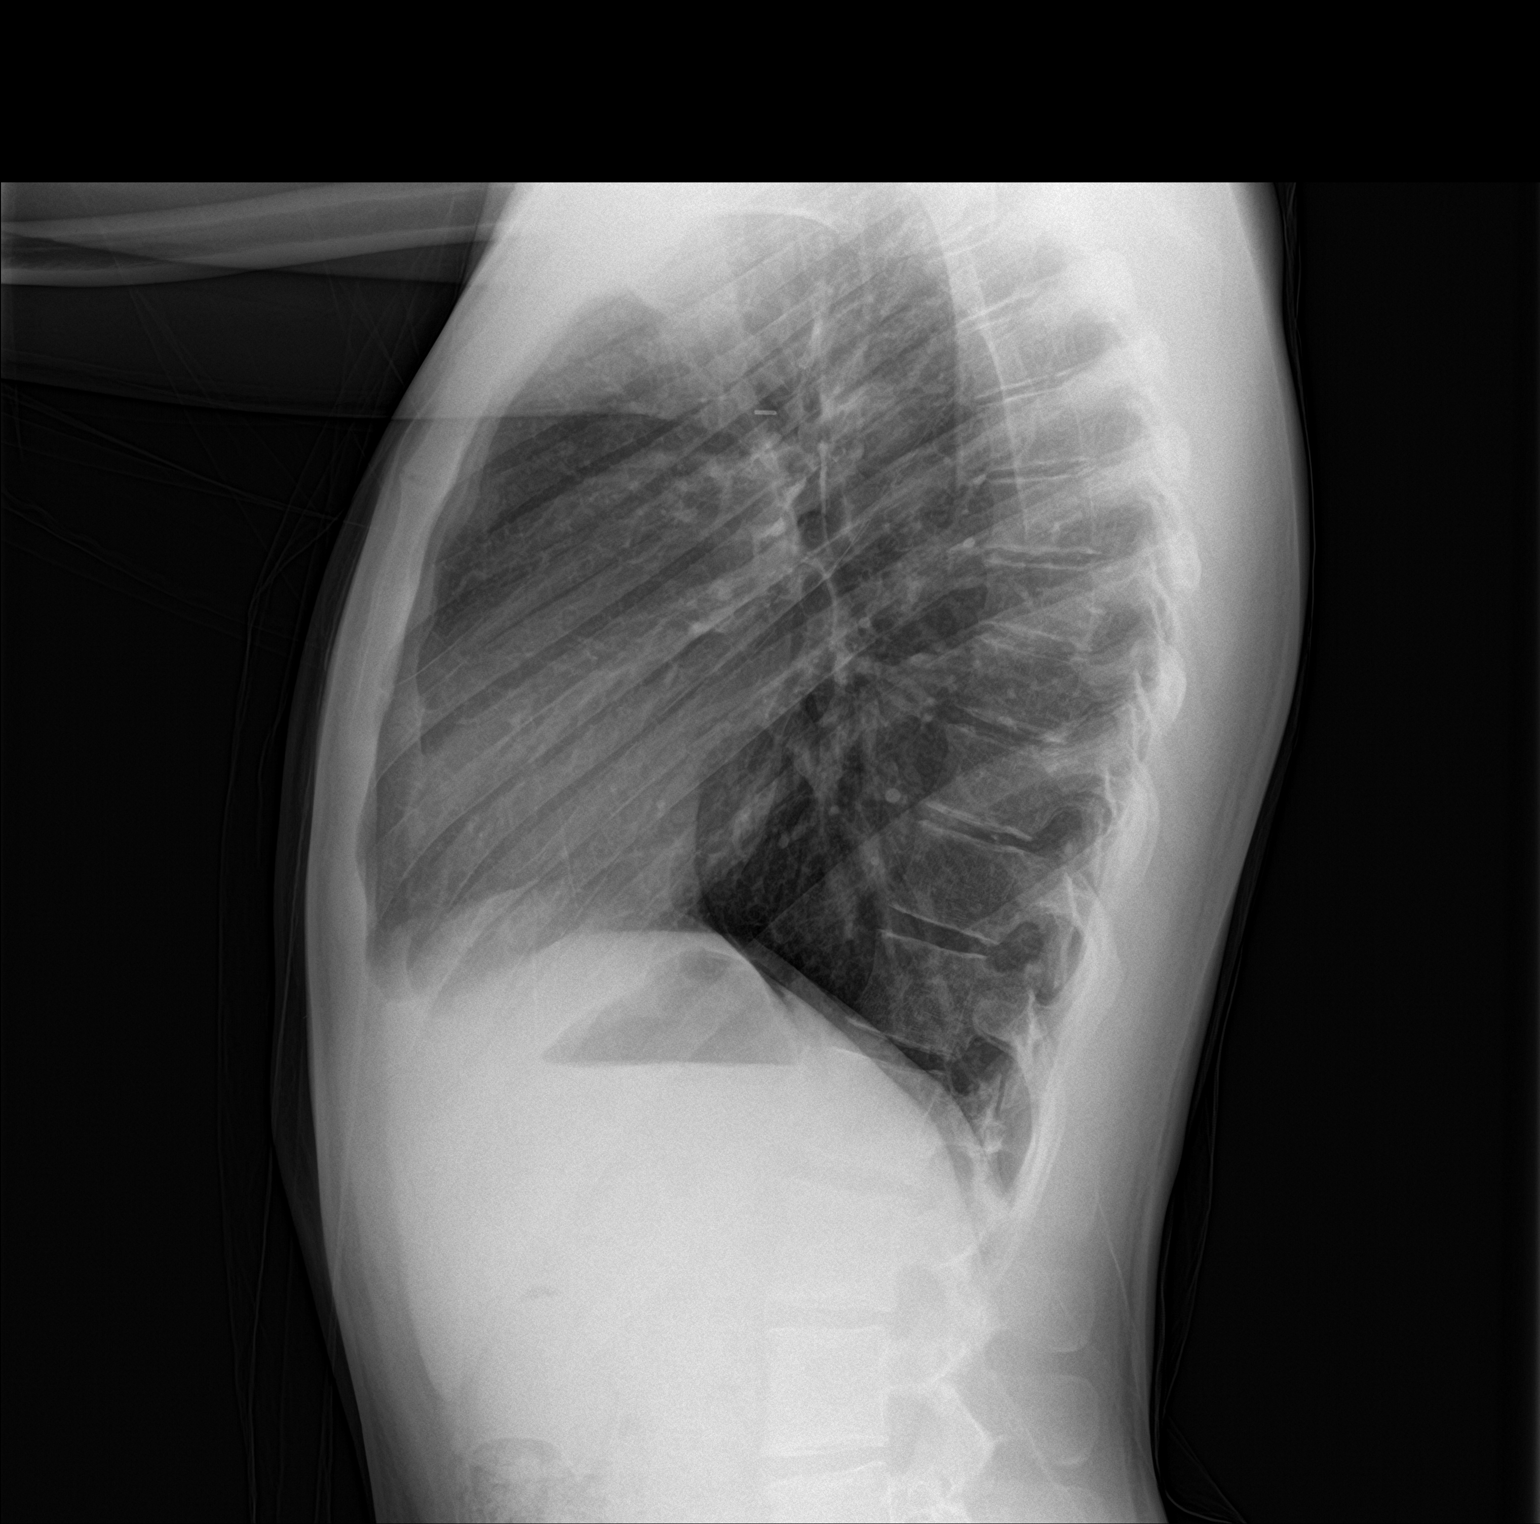

[2 of 2 positions shown; findings below may reference images not displayed]

FINDINGS: The heart size and mediastinal contours are within normal limits.
Both lungs are clear. The lungs appear hyperexpanded. Surgical clips
in the mediastinum. The visualized skeletal structures are
unremarkable.
IMPRESSION: No active cardiopulmonary disease.

## 2020-08-03 ENCOUNTER — Other Ambulatory Visit: Payer: Self-pay | Admitting: Internal Medicine

## 2020-08-03 DIAGNOSIS — K219 Gastro-esophageal reflux disease without esophagitis: Secondary | ICD-10-CM

## 2020-08-03 DIAGNOSIS — K5900 Constipation, unspecified: Secondary | ICD-10-CM

## 2020-08-08 ENCOUNTER — Encounter: Payer: Self-pay | Admitting: Internal Medicine

## 2020-08-08 ENCOUNTER — Ambulatory Visit (INDEPENDENT_AMBULATORY_CARE_PROVIDER_SITE_OTHER): Payer: Medicaid Other | Admitting: Hospice and Palliative Medicine

## 2020-08-08 ENCOUNTER — Other Ambulatory Visit: Payer: Self-pay

## 2020-08-08 VITALS — BP 116/82 | HR 60 | Temp 97.4°F | Resp 16 | Ht 67.0 in | Wt 124.2 lb

## 2020-08-08 DIAGNOSIS — R109 Unspecified abdominal pain: Secondary | ICD-10-CM

## 2020-08-08 DIAGNOSIS — R809 Proteinuria, unspecified: Secondary | ICD-10-CM

## 2020-08-08 DIAGNOSIS — R300 Dysuria: Secondary | ICD-10-CM | POA: Diagnosis not present

## 2020-08-08 LAB — POCT URINALYSIS DIPSTICK
Blood, UA: NEGATIVE
Glucose, UA: NEGATIVE
Ketones, UA: NEGATIVE
Nitrite, UA: NEGATIVE
Protein, UA: POSITIVE — AB
Spec Grav, UA: 1.025 (ref 1.010–1.025)
Urobilinogen, UA: 0.2 E.U./dL
pH, UA: 6 (ref 5.0–8.0)

## 2020-08-08 NOTE — Progress Notes (Signed)
[facility] [address] [place], [place] [address]  Internal MEDICINE  Office Visit Note  Patient Name: Jeff Coffey. Jeff Coffey  [id number]  [id number]  Date of Service: 08/09/2020  Chief Complaint  Patient presents with  . Acute Visit    Possible kidney infection, noticed it on Friday, back pain     HPI Pt is here for a sick visit. Bilateral flank pain--started about 2 weeks ago, having urinary symptoms--urgency, denies burning or penile discharge Has not noticed any blood in urine, no odor or change in color or consistency Denies fevers or abdominal pain   Current Medication:  Outpatient Encounter Medications as of 08/08/2020  Medication Sig  . bisacodyl (DULCOLAX) 10 MG suppository Place 1 suppository (10 mg total) rectally as needed for moderate constipation.  . butalbital-acetaminophen-caffeine (FIORICET, ESGIC) 50-325-40 MG tablet One tab po bid prn for migraine  . divalproex (DEPAKOTE ER) 500 MG 24 hr tablet Take one tab at bed time for migraine headaches  . famotidine (PEPCID) 20 MG tablet TAKE 1 TABLET BY MOUTH TWICE A DAY  . linaclotide (LINZESS) 145 MCG CAPS capsule Take 1 capsule (145 mcg total) by mouth daily before breakfast.  . metoCLOPramide (REGLAN) 10 MG tablet Take 1 tablet (10 mg total) by mouth 3 (three) times daily with meals.   No facility-administered encounter medications on file as of 08/08/2020.      Medical History: Past Medical History:  Diagnosis Date  . Depression   . Migraines      Vital Signs: BP 116/82   Pulse 60   Temp (!) 97.4 F (36.3 C)   Resp 16   Ht 5\' 7"  (1.702 m)   Wt 124 lb 3.2 oz (56.3 kg)   SpO2 98%   BMI 19.45 kg/m    Review of Systems  Constitutional: Negative for chills, fatigue and unexpected weight change.  HENT: Negative for congestion, postnasal drip, rhinorrhea, sneezing and sore throat.   Eyes: Negative for redness.  Respiratory: Negative for cough, chest tightness and shortness of breath.    Cardiovascular: Negative for chest pain and palpitations.  Gastrointestinal: Negative for abdominal pain, constipation, diarrhea, nausea and vomiting.  Genitourinary: Positive for flank pain and urgency. Negative for dysuria and frequency.  Musculoskeletal: Negative for arthralgias, back pain, joint swelling and neck pain.  Skin: Negative for rash.  Neurological: Negative for tremors and numbness.  Hematological: Negative for adenopathy. Does not bruise/bleed easily.  Psychiatric/Behavioral: Negative for behavioral problems (Depression), sleep disturbance and suicidal ideas. The patient is not nervous/anxious.     Physical Exam Vitals reviewed.  Constitutional:      Appearance: Normal appearance. He is normal weight.  Cardiovascular:     Rate and Rhythm: Normal rate and regular rhythm.     Pulses: Normal pulses.     Heart sounds: Normal heart sounds.  Pulmonary:     Effort: Pulmonary effort is normal.     Breath sounds: Normal breath sounds.  Abdominal:     Tenderness: There is no right CVA tenderness or left CVA tenderness.  Musculoskeletal:        General: Normal range of motion.  Skin:    General: Skin is warm.  Neurological:     General: No focal deficit present.     Mental Status: He is alert and oriented to person, place, and time. Mental status is at baseline.  Psychiatric:        Mood and Affect: Mood normal.  Behavior: Behavior normal.        Thought Content: Thought content normal.        Judgment: Judgment normal.     Assessment/Plan: 1. Bilateral flank pain Will await urine culture results prior to initiating antibiotic therapy [place] for possible stone contributing to symptoms - POCT Urinalysis Dipstick - US Renal; Future - CULTURE, URINE COMPREHENSIVE  2. Proteinuria, unspecified type Review [place] for possible underlying cause of proteinuria - US Renal; Future  3. Dysuria - POCT Urinalysis Dipstick - CULTURE, URINE COMPREHENSIVE  General  Counseling: [name] verbalizes understanding of the findings of todays visit and agrees with plan of treatment. I have discussed any further diagnostic evaluation that may be needed or ordered today. We also reviewed his medications today. he has been encouraged to call the office with any questions or concerns that should arise related to todays visit.   Orders Placed This Encounter  Procedures  . CULTURE, URINE COMPREHENSIVE  . US Renal  . POCT Urinalysis Dipstick   Time spent: 25 Minutes Time spent includes review of chart, medications, test results and follow-up plan with the patient.  This patient was seen by Leeanne Deed AGNP-C in Collaboration with Dr Lyndon Code as a part of collaborative care agreement.  Lubertha Basque [facility]

## 2020-08-09 ENCOUNTER — Encounter: Payer: Self-pay | Admitting: Hospice and Palliative Medicine

## 2020-08-10 ENCOUNTER — Other Ambulatory Visit: Payer: Self-pay | Admitting: Hospice and Palliative Medicine

## 2020-08-10 LAB — CULTURE, URINE COMPREHENSIVE

## 2020-08-10 MED ORDER — NITROFURANTOIN MONOHYD MACRO 100 MG PO CAPS: 100.0000 mg | ORAL_CAPSULE | Freq: Two times a day (BID) | ORAL | 0 refills | Status: DC

## 2020-08-10 NOTE — Progress Notes (Signed)
Spoke to patient, Macrobid sent to pharmacy.

## 2020-08-16 ENCOUNTER — Other Ambulatory Visit: Payer: Self-pay | Admitting: Hospice and Palliative Medicine

## 2020-08-22 ENCOUNTER — Other Ambulatory Visit: Payer: Self-pay | Admitting: Physician Assistant

## 2020-08-22 ENCOUNTER — Encounter: Payer: Self-pay | Admitting: Hospice and Palliative Medicine

## 2020-08-22 ENCOUNTER — Other Ambulatory Visit: Payer: Self-pay

## 2020-08-22 ENCOUNTER — Ambulatory Visit: Payer: Medicaid Other | Admitting: Physician Assistant

## 2020-08-22 ENCOUNTER — Ambulatory Visit: Payer: Medicaid Other | Admitting: Gastroenterology

## 2020-08-22 DIAGNOSIS — K5900 Constipation, unspecified: Secondary | ICD-10-CM | POA: Diagnosis not present

## 2020-08-22 DIAGNOSIS — F411 Generalized anxiety disorder: Secondary | ICD-10-CM

## 2020-08-22 DIAGNOSIS — R809 Proteinuria, unspecified: Secondary | ICD-10-CM

## 2020-08-22 DIAGNOSIS — R300 Dysuria: Secondary | ICD-10-CM | POA: Diagnosis not present

## 2020-08-22 LAB — POCT URINALYSIS DIPSTICK
Bilirubin, UA: NEGATIVE
Glucose, UA: NEGATIVE
Ketones, UA: NEGATIVE
Leukocytes, UA: NEGATIVE
Nitrite, UA: NEGATIVE
Protein, UA: POSITIVE — AB
Spec Grav, UA: 1.02 (ref 1.010–1.025)
Urobilinogen, UA: 0.2 E.U./dL
pH, UA: 6 (ref 5.0–8.0)

## 2020-08-22 MED ORDER — ESCITALOPRAM OXALATE 5 MG PO TABS: 5.0000 mg | ORAL_TABLET | Freq: Every day | ORAL | 2 refills | Status: DC

## 2020-08-22 NOTE — Progress Notes (Signed)
[facility] [address] [place], [place] [address]  Internal MEDICINE  Office Visit Note  Patient Name: Jeff Coffey  [id number]  [id number]  Date of Service: 08/22/2020  Chief Complaint  Patient presents with  . Urinary Tract Infection    Symptoms started 3 weeks ago. Urine frequency, some abdominal pain, dark urine     HPI Pt is here for a sick visit. -3 weeks ago started on ABX for 10 days for a UTI. No urinary frequency or urgency anymore. Had a pain in lower abdomen on Sunday evening that lasted about 10-15 minutes and then went away and that was the only time he has had any pain. Dark yellow urine twice last night. Urinates maybe 3+ times per day depending on how much fluid he drinks. Discussed cutting back on mtn dew and increasing water intake since that could mean he is dehydrated. Denies any fever, chills,body aches, flank pain, or blood in urine. -Linzess has helped with constipation and is having more regular BM. Appt with GI later this afternoon  Current Medication:  Outpatient Encounter Medications as of 08/22/2020  Medication Sig  . bisacodyl (DULCOLAX) 10 MG suppository Place 1 suppository (10 mg total) rectally as needed for moderate constipation.  . butalbital-acetaminophen-caffeine (FIORICET, ESGIC) 50-325-40 MG tablet One tab po bid prn for migraine  . divalproex (DEPAKOTE ER) 500 MG 24 hr tablet Take one tab at bed time for migraine headaches  . famotidine (PEPCID) 20 MG tablet TAKE 1 TABLET BY MOUTH TWICE A DAY  . linaclotide (LINZESS) 145 MCG CAPS capsule Take 1 capsule (145 mcg total) by mouth daily before breakfast.  . metoCLOPramide (REGLAN) 5 MG tablet TAKE ONE TAB PO BID PRN FOR NAUSEA  . nitrofurantoin, macrocrystal-monohydrate, (MACROBID) 100 MG capsule Take 1 capsule (100 mg total) by mouth 2 (two) times daily.  . [DISCONTINUED] escitalopram (LEXAPRO) 5 MG tablet Take 1 tablet (5 mg total) by mouth daily.   No facility-administered  encounter medications on file as of 08/22/2020.      Medical History: Past Medical History:  Diagnosis Date  . Depression   . Migraines      Vital Signs: BP 98/70   Pulse 67   Temp 98.4 F (36.9 C)   Resp 16   Ht 5\' 7"  (1.702 m)   Wt 123 lb 6.4 oz (56 kg)   SpO2 95%   BMI 19.33 kg/m    Review of Systems  Constitutional: Positive for unexpected weight change. Negative for fatigue and fever.  HENT: Negative for congestion, mouth sores and postnasal drip.   Respiratory: Negative for cough.   Cardiovascular: Negative for chest pain.  Gastrointestinal: Positive for constipation. Negative for blood in stool.  Genitourinary: Negative for difficulty urinating, dysuria, flank pain, frequency and hematuria.  Psychiatric/Behavioral: The patient is nervous/anxious.     Physical Exam Constitutional:      General: He is not in acute distress.    Appearance: He is well-developed and normal weight. He is not diaphoretic.  HENT:     Head: Normocephalic and atraumatic.     Mouth/Throat:     Pharynx: No oropharyngeal exudate.  Eyes:     Pupils: Pupils are equal, round, and reactive to light.  Neck:     Thyroid: No thyromegaly.     Vascular: No JVD.     Trachea: No tracheal deviation.  Cardiovascular:     Rate and Rhythm: Normal rate and regular rhythm.     Heart sounds:  Normal heart sounds. No murmur heard. No friction rub. No gallop.   Pulmonary:     Effort: Pulmonary effort is normal. No respiratory distress.     Breath sounds: No wheezing or rales.  Chest:     Chest wall: No tenderness.  Abdominal:     General: Bowel sounds are normal. There is no distension.     Palpations: Abdomen is soft.     Tenderness: There is no abdominal tenderness. There is no right CVA tenderness, left CVA tenderness or guarding.  Musculoskeletal:        General: Normal range of motion.     Cervical back: Normal range of motion and neck supple.  Lymphadenopathy:     Cervical: No cervical  adenopathy.  Skin:    General: Skin is warm and dry.  Neurological:     Mental Status: He is alert and oriented to person, place, and time.     Cranial Nerves: No cranial nerve deficit.  Psychiatric:        Behavior: Behavior normal.        Thought Content: Thought content normal.        Judgment: Judgment normal.     Comments: Pt is anxious       Assessment/Plan: 1. Dysuria - POCT Urinalysis Dipstick showed trace blood and protein will send for culture to ensure infection cleared. Pt will also have CMP done to look at electrolytes and renal function. Encouraged pt to drink more water and stay well hydrated and avoid sodas. - CULTURE, URINE COMPREHENSIVE - Comprehensive metabolic panel  2. Proteinuria, unspecified type Renal US ordered last visit and will be scheduled. - Comprehensive metabolic panel  3. GAD (generalized anxiety disorder) Will start on low dose Lexapro for his anxiety. May need to increase dose if not noticing a difference in anxiety on the 5mg .  4. Constipation, unspecified constipation type Continue Linzess and f/u with GI today as scheduled.   General Counseling: Jeff Coffey verbalizes understanding of the findings of todays visit and agrees with plan of treatment. I have discussed any further diagnostic evaluation that may be needed or ordered today. We also reviewed his medications today. he has been encouraged to call the office with any questions or concerns that should arise related to todays visit.    Counseling:    Orders Placed This Encounter  Procedures  . CULTURE, URINE COMPREHENSIVE  . Comprehensive metabolic panel  . POCT Urinalysis Dipstick    Meds ordered this encounter  Medications  . DISCONTD: escitalopram (LEXAPRO) 5 MG tablet    Sig: Take 1 tablet (5 mg total) by mouth daily.    Dispense:  30 tablet    Refill:  2    Time spent:30 Minutes

## 2020-08-23 LAB — COMPREHENSIVE METABOLIC PANEL
ALT: 14 IU/L (ref 0–44)
AST: 15 IU/L (ref 0–40)
Albumin/Globulin Ratio: 2.2 (ref 1.2–2.2)
Albumin: 5.3 g/dL — ABNORMAL HIGH (ref 4.1–5.2)
Alkaline Phosphatase: 18 IU/L — ABNORMAL LOW (ref 44–121)
BUN/Creatinine Ratio: 17 (ref 9–20)
BUN: 15 mg/dL (ref 6–20)
Bilirubin Total: 0.5 mg/dL (ref 0.0–1.2)
CO2: 25 mmol/L (ref 20–29)
Calcium: 10.1 mg/dL (ref 8.7–10.2)
Chloride: 101 mmol/L (ref 96–106)
Creatinine, Ser: 0.89 mg/dL (ref 0.76–1.27)
Globulin, Total: 2.4 g/dL (ref 1.5–4.5)
Glucose: 78 mg/dL (ref 65–99)
Potassium: 4.9 mmol/L (ref 3.5–5.2)
Sodium: 140 mmol/L (ref 134–144)
Total Protein: 7.7 g/dL (ref 6.0–8.5)
eGFR: 125 mL/min/{1.73_m2} (ref >59)

## 2020-08-26 ENCOUNTER — Other Ambulatory Visit: Payer: Self-pay | Admitting: Hospice and Palliative Medicine

## 2020-08-26 DIAGNOSIS — K5900 Constipation, unspecified: Secondary | ICD-10-CM

## 2020-08-26 DIAGNOSIS — K219 Gastro-esophageal reflux disease without esophagitis: Secondary | ICD-10-CM

## 2020-08-28 LAB — CULTURE, URINE COMPREHENSIVE

## 2020-09-05 ENCOUNTER — Other Ambulatory Visit: Payer: Self-pay | Admitting: Hospice and Palliative Medicine

## 2020-09-14 ENCOUNTER — Other Ambulatory Visit: Payer: Self-pay | Admitting: Internal Medicine

## 2020-09-14 DIAGNOSIS — F411 Generalized anxiety disorder: Secondary | ICD-10-CM

## 2020-09-21 ENCOUNTER — Ambulatory Visit: Payer: Medicaid Other | Admitting: Physician Assistant

## 2020-09-25 ENCOUNTER — Other Ambulatory Visit: Payer: Self-pay | Admitting: Hospice and Palliative Medicine

## 2020-09-25 DIAGNOSIS — K219 Gastro-esophageal reflux disease without esophagitis: Secondary | ICD-10-CM

## 2020-09-25 DIAGNOSIS — K5900 Constipation, unspecified: Secondary | ICD-10-CM

## 2020-09-27 ENCOUNTER — Ambulatory Visit: Payer: Medicaid Other

## 2020-09-27 DIAGNOSIS — R809 Proteinuria, unspecified: Secondary | ICD-10-CM | POA: Diagnosis not present

## 2020-09-27 DIAGNOSIS — R109 Unspecified abdominal pain: Secondary | ICD-10-CM

## 2020-10-05 ENCOUNTER — Ambulatory Visit: Payer: Medicaid Other | Admitting: Nurse Practitioner

## 2020-10-05 ENCOUNTER — Encounter: Payer: Self-pay | Admitting: Nurse Practitioner

## 2020-10-05 ENCOUNTER — Other Ambulatory Visit: Payer: Self-pay

## 2020-10-05 VITALS — BP 96/58 | HR 62 | Temp 98.0°F | Resp 16 | Ht 67.0 in | Wt 124.6 lb

## 2020-10-05 DIAGNOSIS — F411 Generalized anxiety disorder: Secondary | ICD-10-CM

## 2020-10-05 DIAGNOSIS — M545 Low back pain, unspecified: Secondary | ICD-10-CM

## 2020-10-05 DIAGNOSIS — K219 Gastro-esophageal reflux disease without esophagitis: Secondary | ICD-10-CM | POA: Diagnosis not present

## 2020-10-05 MED ORDER — CYCLOBENZAPRINE HCL 10 MG PO TABS: 10.0000 mg | ORAL_TABLET | Freq: Every day | ORAL | 0 refills | Status: DC

## 2020-10-05 MED ORDER — BUPROPION HCL ER (XL) 150 MG PO TB24: 150.0000 mg | ORAL_TABLET | Freq: Every day | ORAL | 1 refills | Status: AC

## 2020-10-05 MED ORDER — MELOXICAM 7.5 MG PO TABS: 7.5000 mg | ORAL_TABLET | Freq: Every day | ORAL | 0 refills | Status: DC

## 2020-10-05 NOTE — Progress Notes (Signed)
[facility] [address] [place], [place] [address]  Internal MEDICINE  Office Visit Note  Patient Name: Jeff Coffey. Jeff Coffey  [id number]  [id number]  Date of Service: 10/08/2020  Chief Complaint  Patient presents with  . Follow-up    Review [place] results,   . Depression    HPI  [name] presents for a follow up visit to discuss renal ultrasound results. He was recently treated for a UTI which resolved with antibiotic treatment.  -Discussed renal ultrasound, results were negative for any abnormalities.  -Back pain midline thoracolumbar - x1 month, feels sore, comes and goes, improves with stretching and moving around. Has had no xrays of his back done. 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 Reports losing 10 lbs over the past 2 years and that he feels weaker. BMI and weight are wnl.   Current Medication: Outpatient Encounter Medications as of 10/05/2020  Medication Sig  . bisacodyl (DULCOLAX) 10 MG suppository Place 1 suppository (10 mg total) rectally as needed for moderate constipation.  Marland Kitchen buPROPion (WELLBUTRIN XL) 150 MG 24 hr tablet Take 1 tablet (150 mg total) by mouth daily.  . cyclobenzaprine (FLEXERIL) 10 MG tablet Take 1 tablet (10 mg total) by mouth at bedtime. Take one tab po qhs for back spasm prn only  . famotidine (PEPCID) 20 MG tablet TAKE 1 TABLET BY MOUTH TWICE A DAY  . LINZESS 145 MCG CAPS capsule TAKE 1 CAPSULE BY MOUTH DAILY BEFORE BREAKFAST.  . meloxicam (MOBIC) 7.5 MG tablet Take 1 tablet (7.5 mg total) by mouth daily.  . metoCLOPramide (REGLAN) 5 MG tablet TAKE ONE TAB PO BID PRN FOR NAUSEA  . [DISCONTINUED] butalbital-acetaminophen-caffeine (FIORICET, ESGIC) 50-325-40 MG tablet One tab po bid prn for migraine (Patient not taking: Reported on 10/05/2020)  . [DISCONTINUED] divalproex (DEPAKOTE ER) 500 MG 24 hr tablet Take one tab at bed time for migraine headaches (Patient not taking: Reported on 10/05/2020)  .  [DISCONTINUED] escitalopram (LEXAPRO) 5 MG tablet TAKE 1 TABLET (5 MG TOTAL) BY MOUTH DAILY. (Patient not taking: Reported on 10/05/2020)  . [DISCONTINUED] nitrofurantoin, macrocrystal-monohydrate, (MACROBID) 100 MG capsule Take 1 capsule (100 mg total) by mouth 2 (two) times daily. (Patient not taking: Reported on 10/05/2020)   No facility-administered encounter medications on file as of 10/05/2020.    Surgical History: Past Surgical History:  Procedure Laterality Date  . heart valve stent    . HERNIA REPAIR      Medical History: Past Medical History:  Diagnosis Date  . Depression   . Migraines     Family History: Family History  Problem Relation Age of Onset  . COPD Mother     Social History   Socioeconomic History  . Marital status: Single    Spouse name: Not on file  . Number of children: Not on file  . Years of education: Not on file  . Highest education level: Not on file  Occupational History  . Not on file  Tobacco Use  . Smoking status: Current Every Day Smoker    Packs/day: 1.00    Types: Cigarettes  . Smokeless tobacco: Current User    Types: Snuff  . Tobacco comment: was smoking 1 1/2 pks a day and now down to 1 pk a day  Vaping Use  . Vaping Use: Former  Substance and Sexual Activity  . Alcohol use: Never  . Drug use: Never  . Sexual activity: Not on file  Other  Topics Concern  . Not on file  Social History Narrative  . Not on file   Social Determinants of Health   Financial Resource Strain: Not on file  Food Insecurity: Not on file  Transportation Needs: Not on file  Physical Activity: Not on file  Stress: Not on file  Social Connections: Not on file  Intimate Partner Violence: Not on file      Review of Systems  Constitutional: Positive for fatigue.  HENT: Negative.   Respiratory: Negative.   Cardiovascular: Negative.   Gastrointestinal: Negative.   Genitourinary: Negative.   Musculoskeletal: Positive for back pain and myalgias  (sore achy muscles in mid to lower back).  Neurological: Negative.   Psychiatric/Behavioral: Negative.     Vital Signs: BP (!) 96/58   Pulse 62   Temp 98 F (36.7 C)   Resp 16   Ht 5\' 7"  (1.702 m)   Wt 124 lb 9.6 oz (56.5 kg)   SpO2 98%   BMI 19.52 kg/m    Physical Exam Vitals reviewed.  Constitutional:      General: He is not in acute distress.    Appearance: Normal appearance. He is normal weight. He is not ill-appearing.  HENT:     Head: Normocephalic and atraumatic.  Cardiovascular:     Rate and Rhythm: Normal rate and regular rhythm.     Pulses: Normal pulses.     Heart sounds: Normal heart sounds.  Pulmonary:     Effort: Pulmonary effort is normal.     Breath sounds: Normal breath sounds.  Musculoskeletal:        General: Tenderness present.     Thoracic back: Spasms and tenderness present.     Lumbar back: Spasms and tenderness present.  Skin:    General: Skin is warm and dry.     Capillary Refill: Capillary refill takes less than 2 seconds.  Neurological:     Mental Status: He is alert and oriented to person, place, and time.    Assessment/Plan: 1. Acute bilateral low back pain without sciatica Previously renal ultrasound was done to evaluate bilateral flank pain. This ultrasound was normal. He is still have midline thoracolumbar back pain. Xray of thoracic and lumbar spine ordered. Flexeril and meloxicam ordered to help with muscle spasm and back pain.  - DG Lumbar Spine Complete; Future - DG Thoracic Spine W/Swimmers; Future - meloxicam (MOBIC) 7.5 MG tablet; Take 1 tablet (7.5 mg total) by mouth daily.  Dispense: 30 tablet; Refill: 0 - cyclobenzaprine (FLEXERIL) 10 MG tablet; Take 1 tablet (10 mg total) by mouth at bedtime. Take one tab po qhs for back spasm prn only  Dispense: 30 tablet; Refill: 0  2. GAD (generalized anxiety disorder) wellbutrin has been working well for the patient in decreasing his anxiety. Refill ordered.  - buPROPion (WELLBUTRIN  XL) 150 MG 24 hr tablet; Take 1 tablet (150 mg total) by mouth daily.  Dispense: 30 tablet; Refill: 1  3. Gastroesophageal reflux disease without esophagitis GERD well-controlled with current medications.     General Counseling: Timon verbalizes understanding of the findings of todays visit and agrees with plan of treatment. I have discussed any further diagnostic evaluation that may be needed or ordered today. We also reviewed his medications today. he has been encouraged to call the office with any questions or concerns that should arise related to todays visit.    Orders Placed This Encounter  Procedures  . DG Lumbar Spine Complete  . DG Thoracic Spine W/Swimmers  Meds ordered this encounter  Medications  . meloxicam (MOBIC) 7.5 MG tablet    Sig: Take 1 tablet (7.5 mg total) by mouth daily.    Dispense:  30 tablet    Refill:  0  . cyclobenzaprine (FLEXERIL) 10 MG tablet    Sig: Take 1 tablet (10 mg total) by mouth at bedtime. Take one tab po qhs for back spasm prn only    Dispense:  30 tablet    Refill:  0  . buPROPion (WELLBUTRIN XL) 150 MG 24 hr tablet    Sig: Take 1 tablet (150 mg total) by mouth daily.    Dispense:  30 tablet    Refill:  1   Return in about 1 month (around 11/05/2020) for F/U, med refill, [place] Mcphee PCP.   Total time spent:30 Minutes Time spent includes review of chart, medications, test results, and follow up plan with the patient.   [place] Controlled Substance Database was reviewed by me.  This patient was seen by [name], FNP-C in collaboration with Dr. Beverely Risen as a part of collaborative care agreement.   Dr Lyndon Code Internal medicine

## 2020-11-06 ENCOUNTER — Ambulatory Visit: Payer: Medicaid Other | Admitting: Nurse Practitioner

## 2021-01-16 ENCOUNTER — Ambulatory Visit: Payer: Medicaid Other | Admitting: Internal Medicine

## 2021-01-17 ENCOUNTER — Ambulatory Visit: Payer: Medicaid Other | Admitting: Nurse Practitioner

## 2021-01-30 ENCOUNTER — Other Ambulatory Visit: Payer: Self-pay | Admitting: Nurse Practitioner

## 2021-01-30 DIAGNOSIS — K5900 Constipation, unspecified: Secondary | ICD-10-CM

## 2021-01-30 DIAGNOSIS — K219 Gastro-esophageal reflux disease without esophagitis: Secondary | ICD-10-CM

## 2021-01-30 MED ORDER — FAMOTIDINE 20 MG PO TABS: 20.0000 mg | ORAL_TABLET | Freq: Two times a day (BID) | ORAL | 2 refills | Status: DC

## 2021-02-21 ENCOUNTER — Other Ambulatory Visit: Payer: Self-pay | Admitting: Physician Assistant

## 2021-02-21 DIAGNOSIS — K5900 Constipation, unspecified: Secondary | ICD-10-CM

## 2021-02-21 DIAGNOSIS — K219 Gastro-esophageal reflux disease without esophagitis: Secondary | ICD-10-CM

## 2021-03-07 ENCOUNTER — Other Ambulatory Visit: Payer: Self-pay | Admitting: Nurse Practitioner

## 2021-03-07 DIAGNOSIS — K219 Gastro-esophageal reflux disease without esophagitis: Secondary | ICD-10-CM

## 2021-03-07 DIAGNOSIS — K5900 Constipation, unspecified: Secondary | ICD-10-CM

## 2021-03-07 NOTE — Telephone Encounter (Signed)
Pt need appt for 90 days  

## 2021-03-09 ENCOUNTER — Other Ambulatory Visit: Payer: Self-pay

## 2021-03-09 ENCOUNTER — Emergency Department
Admission: EM | Admit: 2021-03-09 | Discharge: 2021-03-09 | Disposition: A | Discharge status: Home / Self Care | Payer: Medicaid Other | Attending: Emergency Medicine | Admitting: Emergency Medicine

## 2021-03-09 DIAGNOSIS — K0889 Other specified disorders of teeth and supporting structures: Secondary | ICD-10-CM | POA: Diagnosis present

## 2021-03-09 DIAGNOSIS — K029 Dental caries, unspecified: Secondary | ICD-10-CM | POA: Insufficient documentation

## 2021-03-09 DIAGNOSIS — F17210 Nicotine dependence, cigarettes, uncomplicated: Secondary | ICD-10-CM | POA: Insufficient documentation

## 2021-03-09 MED ORDER — TRAMADOL HCL 50 MG PO TABS: 50.0000 mg | ORAL_TABLET | Freq: Four times a day (QID) | ORAL | 0 refills | Status: DC | PRN

## 2021-03-09 MED ORDER — AMOXICILLIN 875 MG PO TABS
875.0000 mg | ORAL_TABLET | Freq: Two times a day (BID) | ORAL | 0 refills | Status: DC
Start: 2021-03-09 — End: 2021-08-07

## 2021-03-09 MED ORDER — OXYCODONE-ACETAMINOPHEN 5-325 MG PO TABS
1.0000 | ORAL_TABLET | Freq: Once | ORAL | Status: AC
Start: 2021-03-09 — End: 2021-03-09
  Administered 2021-03-09: 1 via ORAL
  Filled 2021-03-09: qty 1

## 2021-03-09 NOTE — ED Provider Notes (Signed)
[facility] Emergency Department Provider Note   ____________________________________________   Event Date/Time   First MD Initiated Contact with Patient 03/09/21 1218     (approximate)  I have reviewed the triage vital signs and the nursing notes.   HISTORY  Chief Complaint Dental Pain    HPI Jeff Coffey. is a 20 y.o. male patient presents with dental pain secondary to devitalized left lower molars.  Patient has appointment slip showing teeth extraction on March 20, 2021.  Requesting pain medication and antibiotics.  Denies fever associated complaint.  Denies abscess.  Rates pain as a 10/10.  Described pain as "achy".  Use over-the-counter ibuprofen for pain.         Past Medical History:  Diagnosis Date   Depression    Migraines     Patient Active Problem List   Diagnosis Date Noted   Palpitations 08/27/2018   Abnormal ECG 08/27/2018   Decreased appetite 08/27/2018   Cigarette smoker 08/27/2018    Past Surgical History:  Procedure Laterality Date   heart valve stent     HERNIA REPAIR      Prior to Admission medications   Medication Sig Start Date End Date Taking? Authorizing Provider  amoxicillin (AMOXIL) 875 MG tablet Take 1 tablet (875 mg total) by mouth 2 (two) times daily. 03/09/21  Yes Joni Reining, PA-C  traMADol (ULTRAM) 50 MG tablet Take 1 tablet (50 mg total) by mouth every 6 (six) hours as needed. 03/09/21 03/09/22 Yes Joni Reining, PA-C  bisacodyl (DULCOLAX) 10 MG suppository Place 1 suppository (10 mg total) rectally as needed for moderate constipation. 06/19/20   Theotis Burrow, NP  buPROPion (WELLBUTRIN XL) 150 MG 24 hr tablet Take 1 tablet (150 mg total) by mouth daily. 10/05/20   [name], NP  famotidine (PEPCID) 20 MG tablet TAKE 1 TABLET BY MOUTH TWICE A DAY. DOCTOR NOT UNDER INSURANCE 02/21/21   [name], NP  LINZESS 145 MCG CAPS capsule TAKE 1 CAPSULE BY MOUTH DAILY BEFORE BREAKFAST.  09/05/20   Theotis Burrow, NP  metoCLOPramide (REGLAN) 5 MG tablet TAKE ONE TAB PO BID PRN FOR NAUSEA 08/17/20   Lyndon Code, MD    Allergies Patient has no known allergies.  Family History  Problem Relation Age of Onset   COPD Mother     Social History Social History   Tobacco Use   Smoking status: Every Day    Packs/day: 1.00    Types: Cigarettes   Smokeless tobacco: Current    Types: Snuff   Tobacco comments:    was smoking 1 1/2 pks a day and now down to 1 pk a day  Vaping Use   Vaping Use: Former  Substance Use Topics   Alcohol use: Never   Drug use: Never    Review of Systems  Constitutional: No fever/chills Eyes: No visual changes. ENT: No sore throat. Cardiovascular: Denies chest pain. Respiratory: Denies shortness of breath. Gastrointestinal: No abdominal pain.  No nausea, no vomiting.  No diarrhea.  No constipation. Genitourinary: Negative for dysuria. Musculoskeletal: Negative for back pain. Skin: Negative for rash. Neurological: Negative for headaches, focal weakness or numbness. Psychiatric: Depression  ____________________________________________   PHYSICAL EXAM:  VITAL SIGNS: ED Triage Vitals  Enc Vitals Group     BP 03/09/21 1121 135/88     Pulse Rate 03/09/21 1121 60     Resp 03/09/21 1121 16     Temp 03/09/21 1121 97.9 F (36.6  C)     Temp Source 03/09/21 1121 Oral     SpO2 03/09/21 1121 100 %     Weight 03/09/21 1208 124 lb 9 oz (56.5 kg)     Height 03/09/21 1208 5\' 7"  (1.702 m)     Head Circumference --      Peak Flow --      Pain Score 03/09/21 1124 10     Pain Loc --      Pain Edu? --      Excl. in GC? --    Constitutional: Alert and oriented. Well appearing and in no acute distress. Eyes: Conjunctivae are normal. PERRL. EOMI. Head: Atraumatic. Nose: No congestion/rhinnorhea. Mouth/Throat: Mucous membranes are moist.  Oropharynx non-erythematous.  Devitalized left lower molars. Neck: No stridor.  No cervical spine  tenderness to palpation. Hematological/Lymphatic/Immunilogical: No cervical lymphadenopathy. Cardiovascular: Normal rate, regular rhythm. Grossly normal heart sounds.  Good peripheral circulation. Respiratory: Normal respiratory effort.  No retractions. Lungs CTAB. ____________________________________________   LABS (all labs ordered are listed, but only abnormal results are displayed)  Labs Reviewed - No data to display ____________________________________________  EKG   ____________________________________________  RADIOLOGY I, 03/11/21, personally viewed and evaluated these images (plain radiographs) as part of my medical decision making, as well as reviewing the written report by the radiologist.  ED MD interpretation:    Official radiology report(s): No results found.  ____________________________________________   PROCEDURES  Procedure(s) performed (including Critical Care):  Procedures   ____________________________________________   INITIAL IMPRESSION / ASSESSMENT AND PLAN / ED COURSE  As part of my medical decision making, I reviewed the following data within the electronic MEDICAL RECORD NUMBER         Patient presents for antibiotics and pain medication secondary to pending dental procedure.  Patient given prescription for amoxicillin and tramadol.  Advised to follow-up with scheduled dental appointment.      ____________________________________________   FINAL CLINICAL IMPRESSION(S) / ED DIAGNOSES  Final diagnoses:  Pain due to dental caries     ED Discharge Orders          Ordered    amoxicillin (AMOXIL) 875 MG tablet  2 times daily        03/09/21 1224    traMADol (ULTRAM) 50 MG tablet  Every 6 hours PRN        03/09/21 1224             Note:  This document was prepared using Dragon voice recognition software and may include unintentional dictation errors.    03/11/21, PA-C 03/09/21 1228    03/11/21,  MD 03/09/21 1406

## 2021-03-09 NOTE — ED Triage Notes (Signed)
Pt c/o left lower dental pain,, states he is set up for surgery 11/1 and the only thing they RX was IBU and needs something stronger

## 2021-03-09 NOTE — Discharge Instructions (Addendum)
Read and follow discharge care instructions.  Take medication as directed.  Narcotic pain medication increase constipation.  Advise over-the-counter laxative secondary to history of constipation.

## 2021-03-09 NOTE — ED Notes (Signed)
See triage note  presents with possible dental infection  states he has pain to left upper and lower gumline

## 2021-05-31 ENCOUNTER — Encounter: Payer: Self-pay | Admitting: Physician Assistant

## 2021-05-31 ENCOUNTER — Ambulatory Visit: Payer: Medicaid Other | Admitting: Physician Assistant

## 2021-05-31 ENCOUNTER — Other Ambulatory Visit: Payer: Self-pay

## 2021-05-31 ENCOUNTER — Telehealth: Payer: Self-pay

## 2021-05-31 DIAGNOSIS — H66011 Acute suppurative otitis media with spontaneous rupture of ear drum, right ear: Secondary | ICD-10-CM

## 2021-05-31 MED ORDER — AMOXICILLIN-POT CLAVULANATE 875-125 MG PO TABS: 1.0000 | ORAL_TABLET | Freq: Two times a day (BID) | ORAL | 0 refills | Status: DC

## 2021-05-31 NOTE — Telephone Encounter (Signed)
Awaiting 05/31/21 office notes for ENT referral-Toni

## 2021-05-31 NOTE — Progress Notes (Signed)
[facility] [address] [place], [place] [address]  Internal MEDICINE  Office Visit Note  Patient Name: Jeff Coffey  [id number]  [id number]  Date of Service: 06/01/2021  Chief Complaint  Patient presents with   Acute Visit    patient went to urgent care for right ear pain, antibiotic and ear drops didnt work, has gotten worse, muffled on right side    Ear Pain     HPI Pt is here for a sick visit. Micah Flesher to urgent care with a right ear infection on 05/18/21. Completed oral ABX and drops. -States that it got a little better, but then Sunday started hurting again. -He does report that his hearing is muffled on the right side and that it has been draining some. -He also reports that his family tried helping him with flushing the ear and that this was very painful. -He doesn't have much congestion with it though did discuss he can try flonase to keep sinuses open and help take pressure off ears.  Current Medication:  Outpatient Encounter Medications as of 05/31/2021  Medication Sig   amoxicillin (AMOXIL) 875 MG tablet Take 1 tablet (875 mg total) by mouth 2 (two) times daily.   amoxicillin-clavulanate (AUGMENTIN) 875-125 MG tablet Take 1 tablet by mouth 2 (two) times daily. Take with food.   bisacodyl (DULCOLAX) 10 MG suppository Place 1 suppository (10 mg total) rectally as needed for moderate constipation.   buPROPion (WELLBUTRIN XL) 150 MG 24 hr tablet Take 1 tablet (150 mg total) by mouth daily.   famotidine (PEPCID) 20 MG tablet TAKE 1 TABLET BY MOUTH TWICE A DAY. DOCTOR NOT UNDER INSURANCE   LINZESS 145 MCG CAPS capsule TAKE 1 CAPSULE BY MOUTH DAILY BEFORE BREAKFAST.   metoCLOPramide (REGLAN) 5 MG tablet TAKE ONE TAB PO BID PRN FOR NAUSEA   traMADol (ULTRAM) 50 MG tablet Take 1 tablet (50 mg total) by mouth every 6 (six) hours as needed.   No facility-administered encounter medications on file as of 05/31/2021.      Medical History: Past Medical History:   Diagnosis Date   Depression    Migraines      Vital Signs: BP 110/78    Pulse 75    Temp 98.3 F (36.8 C)    Resp 16    Ht 5\' 7"  (1.702 m)    Wt 128 lb 3.2 oz (58.2 kg)    SpO2 97%    BMI 20.08 kg/m    Review of Systems  Constitutional:  Negative for fatigue and fever.  HENT:  Positive for ear discharge and ear pain. Negative for congestion, mouth sores and postnasal drip.   Respiratory:  Negative for cough.   Cardiovascular:  Negative for chest pain.  Genitourinary:  Negative for flank pain.  Psychiatric/Behavioral: Negative.     Physical Exam Vitals and nursing note reviewed.  Constitutional:      General: He is not in acute distress.    Appearance: He is well-developed. He is not diaphoretic.  HENT:     Head: Normocephalic and atraumatic.     Left Ear: Tympanic membrane normal.     Ears:     Comments: Right TM could not be visualized due to discharge in ear canal    Mouth/Throat:     Pharynx: No oropharyngeal exudate.  Eyes:     Pupils: Pupils are equal, round, and reactive to light.  Neck:     Thyroid: No thyromegaly.     Vascular: No  JVD.     Trachea: No tracheal deviation.  Cardiovascular:     Rate and Rhythm: Normal rate and regular rhythm.     Heart sounds: Normal heart sounds. No murmur heard.   No friction rub. No gallop.  Pulmonary:     Effort: Pulmonary effort is normal. No respiratory distress.     Breath sounds: No wheezing or rales.  Chest:     Chest wall: No tenderness.  Abdominal:     General: Bowel sounds are normal.     Palpations: Abdomen is soft.  Musculoskeletal:        General: Normal range of motion.     Cervical back: Normal range of motion and neck supple.  Lymphadenopathy:     Cervical: No cervical adenopathy.  Skin:    General: Skin is warm and dry.  Neurological:     Mental Status: He is alert and oriented to person, place, and time.     Cranial Nerves: No cranial nerve deficit.  Psychiatric:        Behavior: Behavior normal.         Thought Content: Thought content normal.        Judgment: Judgment normal.      Assessment/Plan: 1. Non-recurrent acute suppurative otitis media of right ear with spontaneous rupture of tympanic membrane Will send another round of augmentin and refer to ENT. Discussed concern for perforation and to not put anything into ears including any irrigation. He can put an ear plug or cotton ball at opening of ear when showering to keep water from entering. Advised to not force either of these deep into canal.  - amoxicillin-clavulanate (AUGMENTIN) 875-125 MG tablet; Take 1 tablet by mouth 2 (two) times daily. Take with food.  Dispense: 20 tablet; Refill: 0 - Ambulatory referral to ENT   General Counseling: Viet verbalizes understanding of the findings of todays visit and agrees with plan of treatment. I have discussed any further diagnostic evaluation that may be needed or ordered today. We also reviewed his medications today. he has been encouraged to call the office with any questions or concerns that should arise related to todays visit.    Counseling:    Orders Placed This Encounter  Procedures   Ambulatory referral to ENT    Meds ordered this encounter  Medications   amoxicillin-clavulanate (AUGMENTIN) 875-125 MG tablet    Sig: Take 1 tablet by mouth 2 (two) times daily. Take with food.    Dispense:  20 tablet    Refill:  0    Time spent:25 Minutes

## 2021-06-04 NOTE — Telephone Encounter (Signed)
Manually faxed referral to Dr. Marcheta Grammes [phone]

## 2021-06-18 NOTE — Telephone Encounter (Signed)
Spoke with office. They stated when they try to call patient, they get busy signal. I lvm for patient to return my call so I can give him their telephone # to schedule appointment-Toni

## 2021-08-07 ENCOUNTER — Ambulatory Visit
Admission: RE | Admit: 2021-08-07 | Discharge: 2021-08-07 | Disposition: A | Discharge status: Home / Self Care | Payer: Medicaid Other | Attending: Internal Medicine | Admitting: Internal Medicine

## 2021-08-07 ENCOUNTER — Ambulatory Visit
Admission: RE | Admit: 2021-08-07 | Discharge: 2021-08-07 | Disposition: A | Discharge status: Home / Self Care | Payer: Medicaid Other | Source: Ambulatory Visit | Attending: Nurse Practitioner | Admitting: Nurse Practitioner

## 2021-08-07 ENCOUNTER — Ambulatory Visit: Payer: Medicaid Other | Admitting: Nurse Practitioner

## 2021-08-07 ENCOUNTER — Other Ambulatory Visit
Admission: RE | Admit: 2021-08-07 | Discharge: 2021-08-07 | Disposition: A | Discharge status: Home / Self Care | Payer: Medicaid Other | Source: Home / Self Care | Attending: Nurse Practitioner | Admitting: Nurse Practitioner

## 2021-08-07 ENCOUNTER — Encounter: Payer: Self-pay | Admitting: Internal Medicine

## 2021-08-07 VITALS — BP 119/68 | HR 75 | Temp 98.3°F | Resp 16 | Ht 67.0 in | Wt 125.6 lb

## 2021-08-07 DIAGNOSIS — R631 Polydipsia: Secondary | ICD-10-CM

## 2021-08-07 DIAGNOSIS — M5415 Radiculopathy, thoracolumbar region: Secondary | ICD-10-CM | POA: Diagnosis present

## 2021-08-07 DIAGNOSIS — R1084 Generalized abdominal pain: Secondary | ICD-10-CM

## 2021-08-07 DIAGNOSIS — R7989 Other specified abnormal findings of blood chemistry: Secondary | ICD-10-CM | POA: Diagnosis not present

## 2021-08-07 LAB — COMPREHENSIVE METABOLIC PANEL
ALT: 14 U/L (ref 0–44)
AST: 16 U/L (ref 15–41)
Albumin: 4.5 g/dL (ref 3.5–5.0)
Alkaline Phosphatase: 16 U/L — ABNORMAL LOW (ref 38–126)
Anion gap: 7 (ref 5–15)
BUN: 9 mg/dL (ref 6–20)
CO2: 30 mmol/L (ref 22–32)
Calcium: 9.2 mg/dL (ref 8.9–10.3)
Chloride: 103 mmol/L (ref 98–111)
Creatinine, Ser: 0.97 mg/dL (ref 0.61–1.24)
GFR, Estimated: >60 mL/min (ref >60)
Glucose, Bld: 84 mg/dL (ref 70–99)
Potassium: 4 mmol/L (ref 3.5–5.1)
Sodium: 140 mmol/L (ref 135–145)
Total Bilirubin: 0.9 mg/dL (ref 0.3–1.2)
Total Protein: 7.8 g/dL (ref 6.5–8.1)

## 2021-08-07 LAB — TSH: TSH: 1.911 u[IU]/mL (ref 0.350–4.500)

## 2021-08-07 LAB — T4, FREE: Free T4: 0.91 ng/dL (ref 0.61–1.12)

## 2021-08-07 MED ORDER — DICYCLOMINE HCL 20 MG PO TABS: 20.0000 mg | ORAL_TABLET | Freq: Four times a day (QID) | ORAL | 1 refills | Status: DC

## 2021-08-07 MED ORDER — MELOXICAM 15 MG PO TABS: 15.0000 mg | ORAL_TABLET | Freq: Every day | ORAL | 1 refills | Status: DC

## 2021-08-07 NOTE — Patient Instructions (Signed)
Please go to [facility] center on [address] off of [address] for lumbar and thoracic spine xrays. ? ?Please go to any [facility] for bloodwork.  ? ?Start new medications today.  ? ? ?

## 2021-08-07 NOTE — Progress Notes (Signed)
[facility] ?[address] ?[place], [place] [address] ? ?Internal MEDICINE  ?Office Visit Note ? ?Patient Name: Jeff Coffey. ? [id number]  ?[id number] ? ?Date of Service: 08/07/2021 ? ?Chief Complaint  ?Patient presents with  ? Abdominal Pain  ?  Nausea and heartburn medication is not helping - stomachache is still ongoing, has appetite  ? Back Pain  ?  Back pain could be from childhood injury - hurts when standing for a long time  ? ? ? ?HPI ?Jeff Coffey presents for an acute sick visit for nausea and heartburn.he reports that the medication is not helping. He still has an appetite.  Avoids hot and spicy foods, can eat but does not always have appetite. ?He is also still having back pain but never got the xrays done that were ordered before. He thinks the pain could be from an injury during childhood. He states that his back hurts when he is standing for long periods of time.  ? ? ?Current Medication: ? ?Outpatient Encounter Medications as of 08/07/2021  ?Medication Sig  ? bisacodyl (DULCOLAX) 10 MG suppository Place 1 suppository (10 mg total) rectally as needed for moderate constipation.  ? buPROPion (WELLBUTRIN XL) 150 MG 24 hr tablet Take 1 tablet (150 mg total) by mouth daily.  ? dicyclomine (BENTYL) 20 MG tablet Take 1 tablet (20 mg total) by mouth every 6 (six) hours.  ? famotidine (PEPCID) 20 MG tablet TAKE 1 TABLET BY MOUTH TWICE A DAY. DOCTOR NOT UNDER INSURANCE  ? meloxicam (MOBIC) 15 MG tablet Take 1 tablet (15 mg total) by mouth daily.  ? metoCLOPramide (REGLAN) 5 MG tablet TAKE ONE TAB PO BID PRN FOR NAUSEA  ? [DISCONTINUED] amoxicillin (AMOXIL) 875 MG tablet Take 1 tablet (875 mg total) by mouth 2 (two) times daily.  ? [DISCONTINUED] amoxicillin-clavulanate (AUGMENTIN) 875-125 MG tablet Take 1 tablet by mouth 2 (two) times daily. Take with food.  ? [DISCONTINUED] LINZESS 145 MCG CAPS capsule TAKE 1 CAPSULE BY MOUTH DAILY BEFORE BREAKFAST.  ? [DISCONTINUED] traMADol (ULTRAM) 50 MG tablet Take 1  tablet (50 mg total) by mouth every 6 (six) hours as needed.  ? ?No facility-administered encounter medications on file as of 08/07/2021.  ? ? ? ? ?Medical History: ?Past Medical History:  ?Diagnosis Date  ? Depression   ? Migraines   ? ? ? ?Vital Signs: ?BP 119/68   Pulse 75   Temp 98.3 ?F (36.8 ?C)   Resp 16   Ht _0  (1.702 m)   Wt 125 lb 9.6 oz (57 kg)   SpO2 99%   BMI 19.67 kg/m?  ? ? ?Review of Systems  ?Constitutional:  Negative for chills, fatigue and unexpected weight change.  ?HENT:  Negative for congestion, rhinorrhea, sneezing and sore throat.   ?Eyes:  Negative for redness.  ?Respiratory: Negative.  Negative for cough, chest tightness, shortness of breath and wheezing.   ?Cardiovascular: Negative.  Negative for chest pain and palpitations.  ?Gastrointestinal:  Positive for abdominal pain and nausea. Negative for abdominal distention, blood in stool, constipation, diarrhea and vomiting.  ?Genitourinary:  Negative for dysuria and frequency.  ?Musculoskeletal:  Positive for arthralgias and back pain. Negative for gait problem, joint swelling and neck pain.  ?Skin:  Negative for rash.  ?Neurological: Negative.  Negative for tremors and numbness.  ?Hematological:  Negative for adenopathy. Does not bruise/bleed easily.  ?Psychiatric/Behavioral:  Negative for behavioral problems (Depression), sleep disturbance and suicidal ideas. The patient is not nervous/anxious.   ? ?  Physical Exam ?Vitals reviewed.  ?Constitutional:   ?   General: He is not in acute distress. ?   Appearance: He is well-developed and normal weight. He is not ill-appearing.  ?HENT:  ?   Head: Normocephalic and atraumatic.  ?Cardiovascular:  ?   Rate and Rhythm: Normal rate and regular rhythm.  ?Pulmonary:  ?   Effort: Pulmonary effort is normal. No respiratory distress.  ?Abdominal:  ?   General: Bowel sounds are normal. There is no distension or abdominal bruit. There are no signs of injury.  ?   Palpations: Abdomen is soft. There is  no shifting dullness, fluid wave, mass or pulsatile mass.  ?   Tenderness: There is no abdominal tenderness.  ?   Hernia: No hernia is present.  ?Neurological:  ?   Mental Status: He is alert and oriented to person, place, and time.  ?Psychiatric:     ?   Mood and Affect: Mood normal.     ?   Behavior: Behavior normal.  ? ? ? ? ?Assessment/Plan: ?1. Radiculopathy of thoracolumbar region ?Xrays reordered to rule out any skeletal abnormalities, meloxicam ordered to decrease inflammation and pain. Lab ordered.  ?- DG Lumbar Spine Complete; Future ?- DG Thoracic Spine W/Swimmers; Future ?- CMP14+EGFR ?- meloxicam (MOBIC) 15 MG tablet; Take 1 tablet (15 mg total) by mouth daily.  Dispense: 30 tablet; Refill: 1 ? ?2. Generalized abdominal cramping ?Dicyclomine prescribed to decrease pain from abdominal cramping most likely related to IBS  ?- dicyclomine (BENTYL) 20 MG tablet; Take 1 tablet (20 mg total) by mouth every 6 (six) hours.  Dispense: 120 tablet; Refill: 1 ? ?3. Elevated TSH ?Labs ordered ?- TSH + free T4 ?- CMP14+EGFR ? ?4. Polydipsia ?Rule out diabetes ?- Hgb A1C w/o eAG ?- CMP14+EGFR ? ? ?General Counseling: Jeff Coffey verbalizes understanding of the findings of todays visit and agrees with plan of treatment. I have discussed any further diagnostic evaluation that may be needed or ordered today. We also reviewed his medications today. he has been encouraged to call the office with any questions or concerns that should arise related to todays visit. ? ? ? ?Counseling: ? ? ? ?Orders Placed This Encounter  ?Procedures  ? DG Lumbar Spine Complete  ? DG Thoracic Spine W/Swimmers  ? TSH + free T4  ? Hgb A1C w/o eAG  ? CMP14+EGFR  ? ? ?Meds ordered this encounter  ?Medications  ? dicyclomine (BENTYL) 20 MG tablet  ?  Sig: Take 1 tablet (20 mg total) by mouth every 6 (six) hours.  ?  Dispense:  120 tablet  ?  Refill:  1  ? meloxicam (MOBIC) 15 MG tablet  ?  Sig: Take 1 tablet (15 mg total) by mouth daily.  ?  Dispense:  30  tablet  ?  Refill:  1  ? ? ?Return in about 1 month (around 09/07/2021) for F/U, eval new med with [facility] or Keily Lepp. ? ?[place] Controlled Substance Database was reviewed by me for overdose risk score (ORS) ? ?Time spent:30 Minutes ?Time spent with patient included reviewing progress notes, labs, imaging studies, and discussing plan for follow up.  ? ?This patient was seen by Jonetta Osgood, FNP-C in collaboration with Dr. Clayborn Bigness as a part of collaborative care agreement. ? ?Landen Knoedler R. Valetta Fuller, MSN, FNP-C ?Internal Medicine ?

## 2021-08-08 LAB — HEMOGLOBIN A1C
Hgb A1c MFr Bld: 5.3 % (ref 4.8–5.6)
Mean Plasma Glucose: 105 mg/dL

## 2021-08-10 ENCOUNTER — Telehealth: Payer: Self-pay

## 2021-08-10 NOTE — Telephone Encounter (Signed)
LMOM to go over results ?

## 2021-08-10 NOTE — Progress Notes (Signed)
Please also let the patient know that his x-rays were normal and did not show any abnormalities that would explain his back pain

## 2021-08-10 NOTE — Telephone Encounter (Signed)
-----   Message from Alyssa Abernathy, NP sent at 08/10/2021  2:43 PM EDT ----- ?Please also let the patient know that his x-rays were normal and did not show any abnormalities that would explain his back pain ?

## 2021-08-10 NOTE — Progress Notes (Signed)
Please let the patient know all of his labs were grossly normal and nothing was found that would explain his symptoms and the labs.

## 2021-08-13 ENCOUNTER — Telehealth: Payer: Self-pay

## 2021-08-13 NOTE — Telephone Encounter (Signed)
Spoke to pt, provided results  

## 2021-08-13 NOTE — Telephone Encounter (Signed)
-----   Message from Jonetta Osgood, NP sent at 08/10/2021  2:42 PM EDT ----- ?Please let the patient know all of his labs were grossly normal and nothing was found that would explain his symptoms and the labs. ?

## 2021-08-13 NOTE — Telephone Encounter (Signed)
-----   Message from [name], NP sent at 08/10/2021  2:43 PM EDT ----- ?Please also let the patient know that his x-rays were normal and did not show any abnormalities that would explain his back pain ?

## 2021-08-15 NOTE — Telephone Encounter (Signed)
Spoke to pt, he would like the referral. I briefly explained the process and told him once the ball is rolling the specialist will reach out to him but if he doesn't hear anything then he can touch base with [place] to see where we are in the process.  ?

## 2021-08-17 ENCOUNTER — Other Ambulatory Visit: Payer: Self-pay | Admitting: Nurse Practitioner

## 2021-08-17 DIAGNOSIS — M545 Low back pain, unspecified: Secondary | ICD-10-CM

## 2021-08-17 DIAGNOSIS — M5415 Radiculopathy, thoracolumbar region: Secondary | ICD-10-CM

## 2021-08-19 ENCOUNTER — Encounter: Payer: Self-pay | Admitting: Nurse Practitioner

## 2021-08-23 ENCOUNTER — Telehealth: Payer: Self-pay

## 2021-08-23 NOTE — Telephone Encounter (Signed)
Ortho surgery referral sent via [facility] to Dr. Arva Chafe ?

## 2021-08-30 ENCOUNTER — Other Ambulatory Visit: Payer: Self-pay | Admitting: Nurse Practitioner

## 2021-08-30 DIAGNOSIS — R1084 Generalized abdominal pain: Secondary | ICD-10-CM

## 2021-09-04 ENCOUNTER — Ambulatory Visit: Payer: Medicaid Other | Admitting: Nurse Practitioner

## 2021-09-26 ENCOUNTER — Other Ambulatory Visit: Payer: Self-pay | Admitting: Nurse Practitioner

## 2021-09-26 DIAGNOSIS — R1084 Generalized abdominal pain: Secondary | ICD-10-CM

## 2021-10-02 ENCOUNTER — Other Ambulatory Visit: Payer: Self-pay | Admitting: Nurse Practitioner

## 2021-10-02 DIAGNOSIS — M5415 Radiculopathy, thoracolumbar region: Secondary | ICD-10-CM

## 2021-10-10 ENCOUNTER — Other Ambulatory Visit: Payer: Self-pay | Admitting: Nurse Practitioner

## 2021-10-10 DIAGNOSIS — R1084 Generalized abdominal pain: Secondary | ICD-10-CM

## 2021-10-17 NOTE — Telephone Encounter (Signed)
Per Herbert Seta with [facility], referral closed due to patient not return calls to schedule-Toni

## 2022-01-02 ENCOUNTER — Other Ambulatory Visit: Payer: Self-pay | Admitting: Nurse Practitioner

## 2022-01-02 DIAGNOSIS — H66011 Acute suppurative otitis media with spontaneous rupture of ear drum, right ear: Secondary | ICD-10-CM

## 2022-01-02 DIAGNOSIS — R1084 Generalized abdominal pain: Secondary | ICD-10-CM

## 2022-01-02 DIAGNOSIS — G8929 Other chronic pain: Secondary | ICD-10-CM

## 2022-01-02 DIAGNOSIS — R630 Anorexia: Secondary | ICD-10-CM

## 2022-01-02 DIAGNOSIS — K219 Gastro-esophageal reflux disease without esophagitis: Secondary | ICD-10-CM

## 2022-01-03 ENCOUNTER — Telehealth: Payer: Self-pay

## 2022-01-03 NOTE — Telephone Encounter (Signed)
Otolaryngology referral sent via [facility] to [facility]. Jeff Coffey

## 2022-01-11 ENCOUNTER — Telehealth: Payer: Self-pay

## 2022-01-11 NOTE — Telephone Encounter (Signed)
ENT appointment 01/25/22 w/ [facility]-Toni

## 2022-01-22 ENCOUNTER — Telehealth: Payer: Self-pay

## 2022-01-22 NOTE — Telephone Encounter (Signed)
informed pt's mother that we cannot talk with her thru her mychart about her child  that he will have to call the office and schedule an appt to be seen and he needs a HIPPA form completed for him giving permission for his information to be discussed by or with her.  Pt's mother understood and will have patient call office and schedule an appt, and also to complete a HIPPA form

## 2022-03-20 ENCOUNTER — Telehealth: Payer: Medicaid Other | Admitting: Nurse Practitioner

## 2022-07-08 ENCOUNTER — Ambulatory Visit: Payer: Medicaid Other | Admitting: Nurse Practitioner

## 2022-07-08 ENCOUNTER — Encounter: Payer: Self-pay | Admitting: Nurse Practitioner

## 2022-07-08 VITALS — BP 121/71 | HR 75 | Temp 97.8°F | Resp 16 | Ht 67.0 in | Wt 119.4 lb

## 2022-07-08 DIAGNOSIS — R630 Anorexia: Secondary | ICD-10-CM | POA: Diagnosis not present

## 2022-07-08 DIAGNOSIS — R1084 Generalized abdominal pain: Secondary | ICD-10-CM

## 2022-07-08 DIAGNOSIS — R198 Other specified symptoms and signs involving the digestive system and abdomen: Secondary | ICD-10-CM | POA: Diagnosis not present

## 2022-07-08 DIAGNOSIS — K5900 Constipation, unspecified: Secondary | ICD-10-CM

## 2022-07-08 DIAGNOSIS — K219 Gastro-esophageal reflux disease without esophagitis: Secondary | ICD-10-CM | POA: Diagnosis not present

## 2022-07-08 MED ORDER — DICYCLOMINE HCL 20 MG PO TABS: 20.0000 mg | ORAL_TABLET | Freq: Four times a day (QID) | ORAL | 1 refills | Status: AC

## 2022-07-08 MED ORDER — METOCLOPRAMIDE HCL 5 MG PO TABS: ORAL_TABLET | ORAL | 5 refills | Status: AC

## 2022-07-08 MED ORDER — FAMOTIDINE 20 MG PO TABS: ORAL_TABLET | ORAL | 1 refills | Status: DC

## 2022-07-08 NOTE — Progress Notes (Signed)
[facility] [place], [place] [address]  Internal MEDICINE  Office Visit Note  Patient Name: Jeff Coffey  [id number]  [id number]  Date of Service: 07/08/2022  Chief Complaint  Patient presents with   Acute Visit     HPI Wai presents for an acute sick visit for abdominal GI issues. Has been going on since about 2013, when he was in middle school  More recently -- early in morning -- abdomen tightens up, bloated, distended, alternating diarrhea and constipation Feels nausea, sweating and hot but does not vomit.  Abdomen is sometimes tender to touch No change in color of stools, no mucous or blood in stool noted by patient.  --unexplained weight loss,and loss of appetite.      Current Medication:  Outpatient Encounter Medications as of 07/08/2022  Medication Sig   bisacodyl (DULCOLAX) 10 MG suppository Place 1 suppository (10 mg total) rectally as needed for moderate constipation.   buPROPion (WELLBUTRIN XL) 150 MG 24 hr tablet Take 1 tablet (150 mg total) by mouth daily.   meloxicam (MOBIC) 15 MG tablet TAKE 1 TABLET (15 MG TOTAL) BY MOUTH DAILY.   [DISCONTINUED] dicyclomine (BENTYL) 20 MG tablet TAKE 1 TABLET BY MOUTH EVERY 6 HOURS   [DISCONTINUED] famotidine (PEPCID) 20 MG tablet TAKE 1 TABLET BY MOUTH TWICE A DAY. DOCTOR NOT UNDER INSURANCE   [DISCONTINUED] metoCLOPramide (REGLAN) 5 MG tablet TAKE ONE TAB PO BID PRN FOR NAUSEA   dicyclomine (BENTYL) 20 MG tablet Take 1 tablet (20 mg total) by mouth every 6 (six) hours.   famotidine (PEPCID) 20 MG tablet TAKE 1 TABLET BY MOUTH TWICE A DAY.   metoCLOPramide (REGLAN) 5 MG tablet TAKE ONE TAB PO BID PRN FOR NAUSEA   No facility-administered encounter medications on file as of 07/08/2022.      Medical History: Past Medical History:  Diagnosis Date   Depression    Migraines      Vital Signs: BP 121/71   Pulse 75   Temp 97.8 F (36.6 C)   Resp 16   Ht '5\' 7"'$  (1.702 m)   Wt 119 lb 6.4  oz (54.2 kg)   SpO2 98%   BMI 18.70 kg/m    Review of Systems  Constitutional:  Positive for appetite change, fatigue and unexpected weight change.  HENT:  Positive for trouble swallowing (minimal but sometimes if he does not chew his food.).   Respiratory: Negative.  Negative for cough, chest tightness, shortness of breath and wheezing.   Cardiovascular: Negative.  Negative for chest pain and palpitations.  Gastrointestinal:  Positive for abdominal distention, abdominal pain, constipation, diarrhea and nausea. Negative for blood in stool and vomiting.       Denies any heartburn or acid reflux  Neurological: Negative.     Physical Exam Vitals reviewed.  Constitutional:      Appearance: Normal appearance. He is normal weight. He is not ill-appearing.  HENT:     Head: Normocephalic and atraumatic.  Eyes:     Pupils: Pupils are equal, round, and reactive to light.  Cardiovascular:     Rate and Rhythm: Normal rate and regular rhythm.  Pulmonary:     Effort: Pulmonary effort is normal.  Abdominal:     General: Bowel sounds are normal. There is no distension.     Palpations: Abdomen is soft.     Tenderness: There is no abdominal tenderness.     Hernia: No hernia is present.  Neurological:  Mental Status: He is alert and oriented to person, place, and time.  Psychiatric:        Mood and Affect: Mood normal.        Behavior: Behavior normal.       Assessment/Plan: 1. Generalized abdominal cramping Restart reglan and famotidine. May take dicyclomine as needed. Referred to GI for further evaluation - Ambulatory referral to Gastroenterology - metoCLOPramide (REGLAN) 5 MG tablet; TAKE ONE TAB PO BID PRN FOR NAUSEA  Dispense: 60 tablet; Refill: 5 - famotidine (PEPCID) 20 MG tablet; TAKE 1 TABLET BY MOUTH TWICE A DAY.  Dispense: 60 tablet; Refill: 1 - dicyclomine (BENTYL) 20 MG tablet; Take 1 tablet (20 mg total) by mouth every 6 (six) hours.  Dispense: 360 tablet; Refill:  1  2. Decreased appetite Referred to GI for further evaluation  - Ambulatory referral to Gastroenterology  3. Alternating constipation and diarrhea Referred to GI for further evaluation - Ambulatory referral to Gastroenterology  4. Gastroesophageal reflux disease without esophagitis Restart famotidine - famotidine (PEPCID) 20 MG tablet; TAKE 1 TABLET BY MOUTH TWICE A DAY.  Dispense: 60 tablet; Refill: 1    General Counseling: Nathaniel verbalizes understanding of the findings of todays visit and agrees with plan of treatment. I have discussed any further diagnostic evaluation that may be needed or ordered today. We also reviewed his medications today. he has been encouraged to call the office with any questions or concerns that should arise related to todays visit.    Counseling:    Orders Placed This Encounter  Procedures   Ambulatory referral to Gastroenterology    Meds ordered this encounter  Medications   metoCLOPramide (REGLAN) 5 MG tablet    Sig: TAKE ONE TAB PO BID PRN FOR NAUSEA    Dispense:  60 tablet    Refill:  5   famotidine (PEPCID) 20 MG tablet    Sig: TAKE 1 TABLET BY MOUTH TWICE A DAY.    Dispense:  60 tablet    Refill:  1   dicyclomine (BENTYL) 20 MG tablet    Sig: Take 1 tablet (20 mg total) by mouth every 6 (six) hours.    Dispense:  360 tablet    Refill:  1    Return in about 2 months (around 09/06/2022), or if symptoms worsen or fail to improve, for F/U, Valdez Brannan PCP and need CPE.  [place] Controlled Substance Database was reviewed by me for overdose risk score (ORS)  Time spent:30 Minutes Time spent with patient included reviewing progress notes, labs, imaging studies, and discussing plan for follow up.   This patient was seen by Jonetta Osgood, FNP-C in collaboration with Dr. Clayborn Bigness as a part of collaborative care agreement.  Stormi Vandevelde R. Valetta Fuller, MSN, FNP-C Internal Medicine

## 2022-07-11 ENCOUNTER — Telehealth: Payer: Self-pay | Admitting: Nurse Practitioner

## 2022-07-11 NOTE — Telephone Encounter (Signed)
Awaiting 07/08/22 office notes for GI referral-Toni

## 2022-07-14 ENCOUNTER — Encounter: Payer: Self-pay | Admitting: Nurse Practitioner

## 2022-07-15 ENCOUNTER — Telehealth: Payer: Self-pay | Admitting: Nurse Practitioner

## 2022-07-15 NOTE — Telephone Encounter (Signed)
GI referral sent via [employer] to Dr. Alice Reichert w/ [facility]-Toni

## 2022-07-31 ENCOUNTER — Other Ambulatory Visit: Payer: Self-pay | Admitting: Nurse Practitioner

## 2022-07-31 DIAGNOSIS — R1084 Generalized abdominal pain: Secondary | ICD-10-CM

## 2022-07-31 DIAGNOSIS — K219 Gastro-esophageal reflux disease without esophagitis: Secondary | ICD-10-CM

## 2022-08-01 ENCOUNTER — Telehealth: Payer: Self-pay | Admitting: Nurse Practitioner

## 2022-08-01 NOTE — Telephone Encounter (Signed)
Left vm and sent mychart message to confirm 08/08/22 appointment-Toni

## 2022-08-08 ENCOUNTER — Ambulatory Visit (INDEPENDENT_AMBULATORY_CARE_PROVIDER_SITE_OTHER): Payer: Medicaid Other | Admitting: Nurse Practitioner

## 2022-08-08 ENCOUNTER — Encounter: Payer: Self-pay | Admitting: Nurse Practitioner

## 2022-08-08 VITALS — BP 124/68 | HR 62 | Temp 98.1°F | Resp 16 | Ht 67.0 in | Wt 119.8 lb

## 2022-08-08 DIAGNOSIS — E782 Mixed hyperlipidemia: Secondary | ICD-10-CM

## 2022-08-08 DIAGNOSIS — R634 Abnormal weight loss: Secondary | ICD-10-CM

## 2022-08-08 DIAGNOSIS — R630 Anorexia: Secondary | ICD-10-CM | POA: Diagnosis not present

## 2022-08-08 DIAGNOSIS — R1084 Generalized abdominal pain: Secondary | ICD-10-CM

## 2022-08-08 DIAGNOSIS — Z23 Encounter for immunization: Secondary | ICD-10-CM

## 2022-08-08 DIAGNOSIS — E538 Deficiency of other specified B group vitamins: Secondary | ICD-10-CM

## 2022-08-08 DIAGNOSIS — Z0001 Encounter for general adult medical examination with abnormal findings: Secondary | ICD-10-CM | POA: Diagnosis not present

## 2022-08-08 DIAGNOSIS — E559 Vitamin D deficiency, unspecified: Secondary | ICD-10-CM

## 2022-08-08 MED ORDER — TETANUS-DIPHTH-ACELL PERTUSSIS 5-2.5-18.5 LF-MCG/0.5 IM SUSP
0.5000 mL | Freq: Once | INTRAMUSCULAR | 0 refills | Status: AC
Start: 2022-08-08 — End: 2022-08-08

## 2022-08-08 NOTE — Progress Notes (Signed)
[facility] [place], [place] [address]  Internal MEDICINE  Office Visit Note  Patient Name: Jeff Coffey  [id number]  [id number]  Date of Service: 08/08/2022  Chief Complaint  Patient presents with   Annual Exam   Depression    HPI Jeff Coffey presents for an annual well visit and physical exam.  Well-appearing 22 y.o. male with decreased appetite and unexplained weight loss.  Labs: due for routine labs.  New or worsening pain: none Other concerns: unexplained weight loss, eats and eats Stomach cramping. Has upcoming appointment with GI.    Current Medication: Outpatient Encounter Medications as of 08/08/2022  Medication Sig   bisacodyl (DULCOLAX) 10 MG suppository Place 1 suppository (10 mg total) rectally as needed for moderate constipation.   buPROPion (WELLBUTRIN XL) 150 MG 24 hr tablet Take 1 tablet (150 mg total) by mouth daily.   dicyclomine (BENTYL) 20 MG tablet Take 1 tablet (20 mg total) by mouth every 6 (six) hours.   famotidine (PEPCID) 20 MG tablet TAKE 1 TABLET BY MOUTH TWICE A DAY   meloxicam (MOBIC) 15 MG tablet TAKE 1 TABLET (15 MG TOTAL) BY MOUTH DAILY.   metoCLOPramide (REGLAN) 5 MG tablet TAKE ONE TAB PO BID PRN FOR NAUSEA   [DISCONTINUED] Tdap (BOOSTRIX) 5-2.5-18.5 LF-MCG/0.5 injection Inject 0.5 mLs into the muscle once.   Tdap (BOOSTRIX) 5-2.5-18.5 LF-MCG/0.5 injection Inject 0.5 mLs into the muscle once for 1 dose.   No facility-administered encounter medications on file as of 08/08/2022.    Surgical History: Past Surgical History:  Procedure Laterality Date   heart valve stent     HERNIA REPAIR      Medical History: Past Medical History:  Diagnosis Date   Depression    Migraines     Family History: Family History  Problem Relation Age of Onset   COPD Mother     Social History   Socioeconomic History   Marital status: Single    Spouse name: Not on file   Number of children: Not on file   Years of  education: Not on file   Highest education level: Not on file  Occupational History   Not on file  Tobacco Use   Smoking status: Every Day    Packs/day: 1    Types: Cigarettes   Smokeless tobacco: Former    Types: Snuff    Quit date: 2018   Tobacco comments:    Smokes 1 pack daily  Vaping Use   Vaping Use: Former  Substance and Sexual Activity   Alcohol use: Never   Drug use: Never   Sexual activity: Not on file  Other Topics Concern   Not on file  Social History Narrative   Not on file   Social Determinants of Health   Financial Resource Strain: Not on file  Food Insecurity: Not on file  Transportation Needs: Not on file  Physical Activity: Not on file  Stress: Not on file  Social Connections: Not on file  Intimate Partner Violence: Not on file      Review of Systems  Constitutional:  Positive for appetite change, fatigue and unexpected weight change. Negative for activity change, chills and fever.  HENT:  Positive for trouble swallowing (minimal but sometimes if he does not chew his food.). Negative for congestion, ear pain, rhinorrhea and sore throat.   Eyes: Negative.   Respiratory: Negative.  Negative for cough, chest tightness, shortness of breath and wheezing.   Cardiovascular: Negative.  Negative for  chest pain and palpitations.  Gastrointestinal:  Positive for abdominal distention, abdominal pain, constipation, diarrhea and nausea. Negative for blood in stool and vomiting.       Denies any heartburn or acid reflux  Endocrine: Negative.   Genitourinary: Negative.  Negative for difficulty urinating, dysuria, frequency, hematuria and urgency.  Musculoskeletal: Negative.  Negative for arthralgias, back pain, joint swelling, myalgias and neck pain.  Skin: Negative.  Negative for rash and wound.  Allergic/Immunologic: Negative.  Negative for immunocompromised state.  Neurological: Negative.  Negative for dizziness, seizures, numbness and headaches.   Hematological: Negative.   Psychiatric/Behavioral: Negative.  Negative for behavioral problems, self-injury and suicidal ideas. The patient is not nervous/anxious.     Vital Signs: BP 124/68   Pulse 62   Temp 98.1 F (36.7 C)   Resp 16   Ht 5\' 7"  (1.702 m)   Wt 119 lb 12.8 oz (54.3 kg)   SpO2 98%   BMI 18.76 kg/m    Physical Exam Vitals reviewed.  Constitutional:      General: He is awake. He is not in acute distress.    Appearance: Normal appearance. He is well-developed, well-groomed and normal weight. He is not ill-appearing or diaphoretic.  HENT:     Head: Normocephalic and atraumatic.     Right Ear: Tympanic membrane, ear canal and external ear normal.     Left Ear: Tympanic membrane, ear canal and external ear normal.     Nose: Nose normal. No congestion or rhinorrhea.     Mouth/Throat:     Mouth: Mucous membranes are moist.     Pharynx: Oropharynx is clear. No oropharyngeal exudate or posterior oropharyngeal erythema.  Eyes:     General: Lids are normal. Vision grossly intact. Gaze aligned appropriately. No scleral icterus.       Right eye: No discharge.        Left eye: No discharge.     Extraocular Movements: Extraocular movements intact.     Conjunctiva/sclera: Conjunctivae normal.     Pupils: Pupils are equal, round, and reactive to light.  Neck:     Thyroid: No thyromegaly.     Vascular: No JVD.     Trachea: Trachea and phonation normal. No tracheal deviation.  Cardiovascular:     Rate and Rhythm: Normal rate and regular rhythm.     Pulses: Normal pulses.     Heart sounds: Normal heart sounds, S1 normal and S2 normal. No murmur heard.    No friction rub. No gallop.  Pulmonary:     Effort: Pulmonary effort is normal. No accessory muscle usage or respiratory distress.     Breath sounds: Normal breath sounds and air entry. No stridor. No wheezing or rales.  Chest:     Chest wall: No tenderness.  Abdominal:     General: Abdomen is flat. Bowel sounds are  normal. There is no distension.     Palpations: Abdomen is soft. There is no shifting dullness, fluid wave, mass or pulsatile mass.     Tenderness: There is no abdominal tenderness. There is no guarding or rebound.     Hernia: No hernia is present.  Musculoskeletal:        General: No tenderness or deformity. Normal range of motion.     Cervical back: Normal range of motion and neck supple.     Right lower leg: No edema.     Left lower leg: No edema.  Lymphadenopathy:     Cervical: No cervical adenopathy.  Skin:  General: Skin is warm and dry.     Capillary Refill: Capillary refill takes less than 2 seconds.     Coloration: Skin is not pale.     Findings: No erythema or rash.  Neurological:     Mental Status: He is alert and oriented to person, place, and time.     Cranial Nerves: No cranial nerve deficit.     Motor: No abnormal muscle tone.     Coordination: Coordination normal.     Gait: Gait normal.     Deep Tendon Reflexes: Reflexes are normal and symmetric.  Psychiatric:        Mood and Affect: Mood normal.        Behavior: Behavior normal. Behavior is cooperative.        Thought Content: Thought content normal.        Judgment: Judgment normal.        Assessment/Plan: 1. Encounter for routine adult health examination with abnormal findings Age-appropriate preventive screenings and vaccinations discussed, annual physical exam completed. Routine labs for health maintenance ordered, see below. PHM updated.  - CBC with Differential/Platelet - CMP14+EGFR - Lipid Profile - B12 and Folate Panel - Vitamin D (25 hydroxy) - Hgb A1C w/o eAG - TSH + free T4  2. Unexplained weight loss Routine labs  - CBC with Differential/Platelet - CMP14+EGFR - Lipid Profile - B12 and Folate Panel - Hgb A1C w/o eAG - TSH + free T4  3. Generalized abdominal cramping Routine labs ordered - CBC with Differential/Platelet - CMP14+EGFR - Lipid Profile - B12 and Folate Panel - Hgb  A1C w/o eAG - TSH + free T4  4. Decreased appetite Routine labs ordered - CBC with Differential/Platelet - CMP14+EGFR - Lipid Profile - B12 and Folate Panel - Hgb A1C w/o eAG - TSH + free T4  5. Mixed hyperlipidemia Routine labs ordered - Lipid Profile - Hgb A1C w/o eAG - TSH + free T4  6. B12 deficiency Routine labs ordered - CBC with Differential/Platelet - CMP14+EGFR - B12 and Folate Panel - Hgb A1C w/o eAG - TSH + free T4  7. Vitamin D deficiency Routine labs ordered - Vitamin D (25 hydroxy)  8. Need for vaccination - Tdap ([facility]) 5-2.5-18.5 LF-MCG/0.5 injection; Inject 0.5 mLs into the muscle once for 1 dose.  Dispense: 0.5 mL; Refill: 0     General Counseling: Kadyn verbalizes understanding of the findings of todays visit and agrees with plan of treatment. I have discussed any further diagnostic evaluation that may be needed or ordered today. We also reviewed his medications today. he has been encouraged to call the office with any questions or concerns that should arise related to todays visit.    Orders Placed This Encounter  Procedures   CBC with Differential/Platelet   CMP14+EGFR   Lipid Profile   B12 and Folate Panel   Vitamin D (25 hydroxy)   Hgb A1C w/o eAG   TSH + free T4    Meds ordered this encounter  Medications   Tdap (BOOSTRIX) 5-2.5-18.5 LF-MCG/0.5 injection    Sig: Inject 0.5 mLs into the muscle once for 1 dose.    Dispense:  0.5 mL    Refill:  0    Return in about 2 months (around 10/08/2022) for F/U, Lotus Gover PCP.   Total time spent:30 Minutes Time spent includes review of chart, medications, test results, and follow up plan with the patient.   [place] Controlled Substance Database was reviewed by me.  This patient was  seen by Jonetta Osgood, FNP-C in collaboration with Dr. Clayborn Bigness as a part of collaborative care agreement.  Tamika Shropshire R. Valetta Fuller, MSN, FNP-C Internal medicine

## 2022-08-10 ENCOUNTER — Encounter: Payer: Self-pay | Admitting: Nurse Practitioner

## 2022-09-13 ENCOUNTER — Other Ambulatory Visit
Admission: RE | Admit: 2022-09-13 | Discharge: 2022-09-13 | Disposition: A | Discharge status: Home / Self Care | Payer: Medicaid Other | Attending: Nurse Practitioner | Admitting: Nurse Practitioner

## 2022-09-13 DIAGNOSIS — E782 Mixed hyperlipidemia: Secondary | ICD-10-CM | POA: Diagnosis not present

## 2022-09-13 DIAGNOSIS — R630 Anorexia: Secondary | ICD-10-CM | POA: Insufficient documentation

## 2022-09-13 DIAGNOSIS — Z0001 Encounter for general adult medical examination with abnormal findings: Secondary | ICD-10-CM | POA: Insufficient documentation

## 2022-09-13 DIAGNOSIS — E538 Deficiency of other specified B group vitamins: Secondary | ICD-10-CM | POA: Insufficient documentation

## 2022-09-13 DIAGNOSIS — R634 Abnormal weight loss: Secondary | ICD-10-CM | POA: Insufficient documentation

## 2022-09-13 DIAGNOSIS — E559 Vitamin D deficiency, unspecified: Secondary | ICD-10-CM | POA: Diagnosis not present

## 2022-09-13 DIAGNOSIS — R1084 Generalized abdominal pain: Secondary | ICD-10-CM | POA: Diagnosis not present

## 2022-09-13 LAB — LIPID PANEL
Cholesterol: 138 mg/dL (ref 0–200)
HDL: 35 mg/dL — ABNORMAL LOW (ref >40)
LDL Cholesterol: 92 mg/dL (ref 0–99)
Total CHOL/HDL Ratio: 3.9 RATIO
Triglycerides: 54 mg/dL (ref <150)
VLDL: 11 mg/dL (ref 0–40)

## 2022-09-13 LAB — CBC WITH DIFFERENTIAL/PLATELET
Abs Immature Granulocytes: 0.01 10*3/uL (ref 0.00–0.07)
Basophils Absolute: 0.1 10*3/uL (ref 0.0–0.1)
Basophils Relative: 1 %
Eosinophils Absolute: 0.4 10*3/uL (ref 0.0–0.5)
Eosinophils Relative: 5 %
HCT: 45.6 % (ref 39.0–52.0)
Hemoglobin: 15.5 g/dL (ref 13.0–17.0)
Immature Granulocytes: 0 %
Lymphocytes Relative: 35 %
Lymphs Abs: 2.6 10*3/uL (ref 0.7–4.0)
MCH: 30.3 pg (ref 26.0–34.0)
MCHC: 34 g/dL (ref 30.0–36.0)
MCV: 89.1 fL (ref 80.0–100.0)
Monocytes Absolute: 0.4 10*3/uL (ref 0.1–1.0)
Monocytes Relative: 5 %
Neutro Abs: 4 10*3/uL (ref 1.7–7.7)
Neutrophils Relative %: 54 %
Platelets: 210 10*3/uL (ref 150–400)
RBC: 5.12 MIL/uL (ref 4.22–5.81)
RDW: 11.9 % (ref 11.5–15.5)
WBC: 7.4 10*3/uL (ref 4.0–10.5)
nRBC: 0 % (ref 0.0–0.2)

## 2022-09-13 LAB — COMPREHENSIVE METABOLIC PANEL
ALT: 15 U/L (ref 0–44)
AST: 19 U/L (ref 15–41)
Albumin: 4.5 g/dL (ref 3.5–5.0)
Alkaline Phosphatase: 15 U/L — ABNORMAL LOW (ref 38–126)
Anion gap: 7 (ref 5–15)
BUN: 11 mg/dL (ref 6–20)
CO2: 27 mmol/L (ref 22–32)
Calcium: 9.5 mg/dL (ref 8.9–10.3)
Chloride: 104 mmol/L (ref 98–111)
Creatinine, Ser: 0.84 mg/dL (ref 0.61–1.24)
GFR, Estimated: >60 mL/min (ref >60)
Glucose, Bld: 98 mg/dL (ref 70–99)
Potassium: 4.2 mmol/L (ref 3.5–5.1)
Sodium: 138 mmol/L (ref 135–145)
Total Bilirubin: 1.4 mg/dL — ABNORMAL HIGH (ref 0.3–1.2)
Total Protein: 7.8 g/dL (ref 6.5–8.1)

## 2022-09-13 LAB — TSH: TSH: 1.454 u[IU]/mL (ref 0.350–4.500)

## 2022-09-13 LAB — VITAMIN B12: Vitamin B-12: 262 pg/mL (ref 180–914)

## 2022-09-13 LAB — HEMOGLOBIN A1C
Hgb A1c MFr Bld: 5.3 % (ref 4.8–5.6)
Mean Plasma Glucose: 105.41 mg/dL

## 2022-09-13 LAB — VITAMIN D 25 HYDROXY (VIT D DEFICIENCY, FRACTURES): Vit D, 25-Hydroxy: 33.55 ng/mL (ref 30–100)

## 2022-09-13 LAB — T4, FREE: Free T4: 0.87 ng/dL (ref 0.61–1.12)

## 2022-09-13 LAB — FOLATE: Folate: 12.7 ng/mL (ref >5.9)

## 2022-09-21 NOTE — Progress Notes (Signed)
Will discuss results at upcoming office visit

## 2022-10-08 ENCOUNTER — Ambulatory Visit: Payer: Medicaid Other | Admitting: Nurse Practitioner

## 2023-06-11 IMAGING — CR DG LUMBAR SPINE COMPLETE 4+V
1 series · 5 of 5 positions shown · non-contrast
Comparison: Abdomen radiographs dated 06/29/2020.

CLINICAL DATA: Low back pain for the past 2 weeks following an
injury.

EXAM:
LUMBAR SPINE - COMPLETE 4+ VIEW

[Series 1: dg lumbar spine complete 4 +v · 0.14mm/px · 5 of 5 slices shown]
[im 1/5]
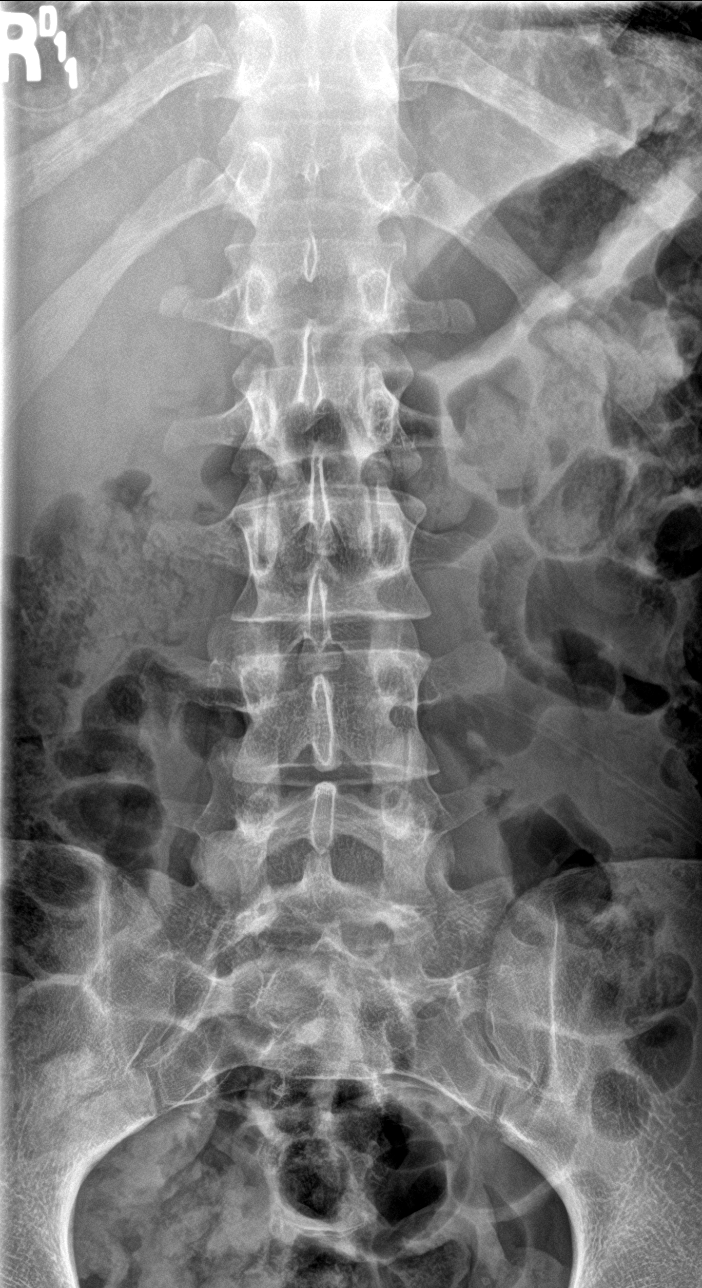
[im 2/5]
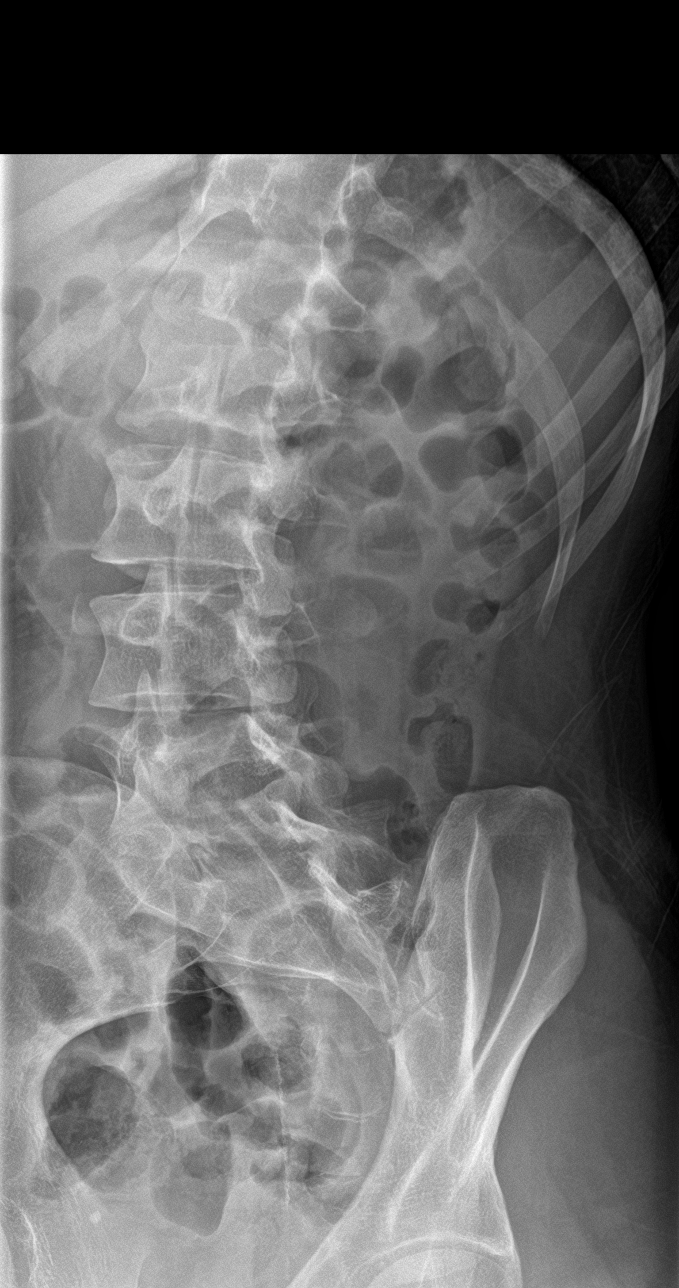
[im 3/5]
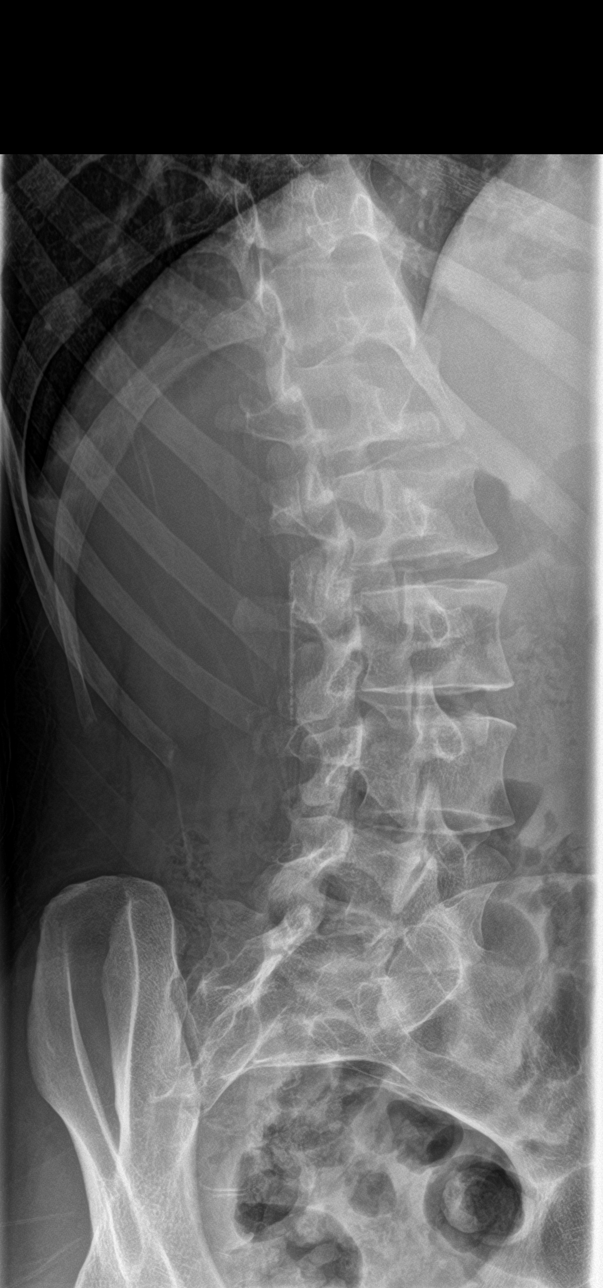
[im 4/5]
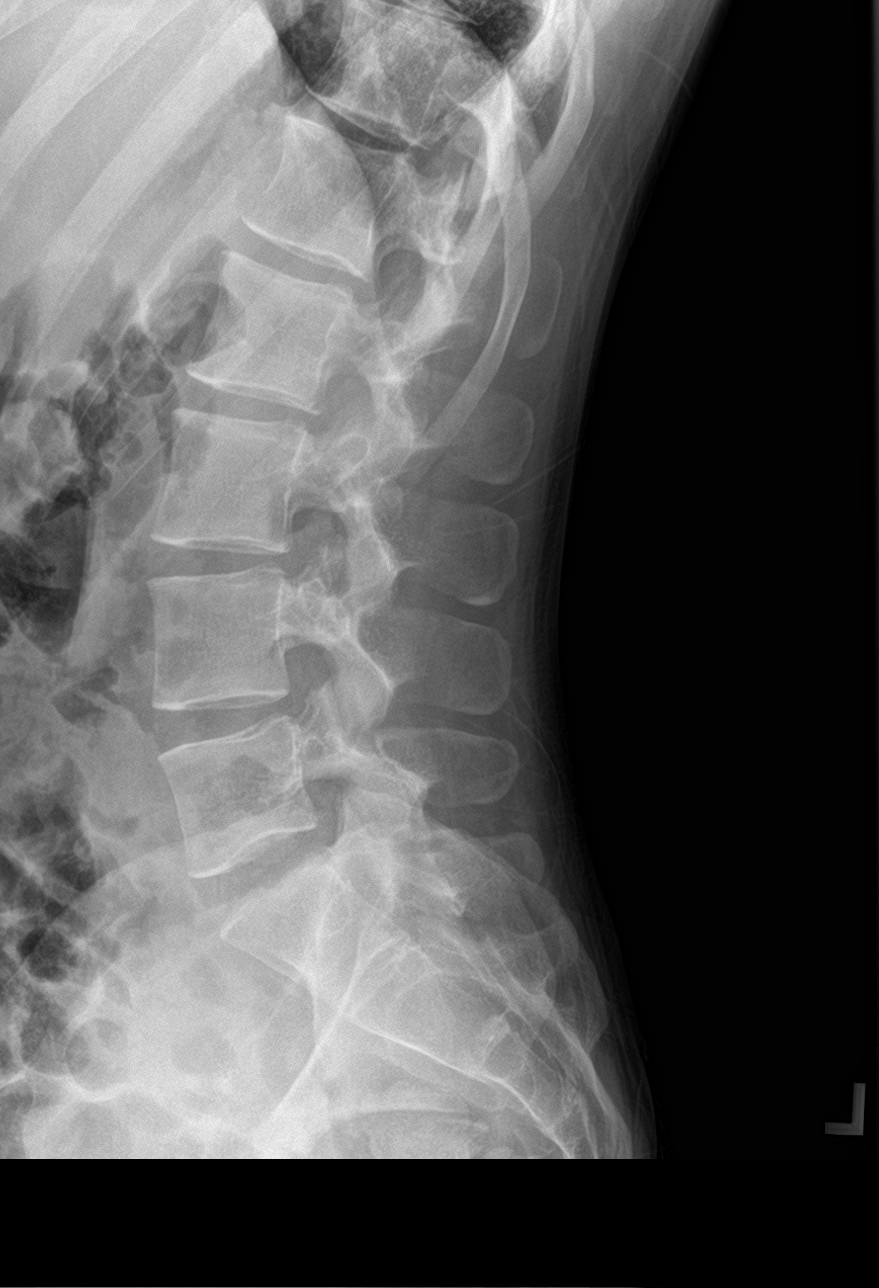
[im 5/5]
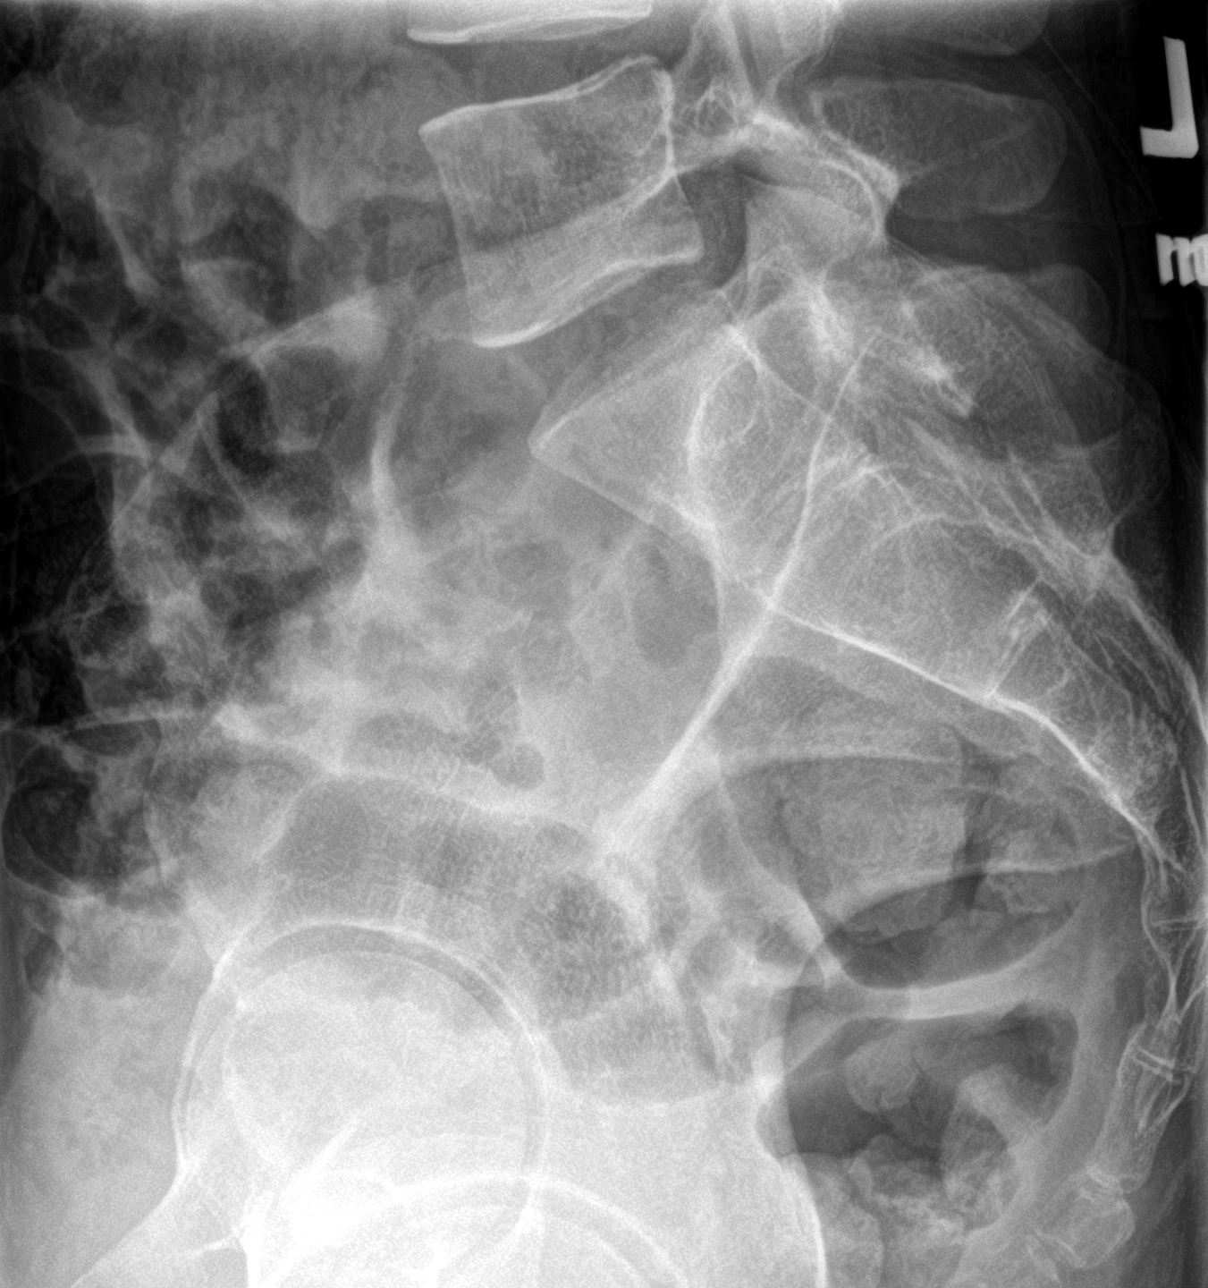

[5 of 5 positions shown; findings below may reference images not displayed]

FINDINGS: Again demonstrated is a transitional lumbosacral vertebra followed
by 4 non-rib-bearing lumbar vertebrae. No fractures, pars defects or
subluxations.
IMPRESSION: No significant abnormality.

## 2023-08-13 ENCOUNTER — Encounter: Payer: Medicaid Other | Admitting: Nurse Practitioner
